# Patient Record
Sex: Female | Born: 1975 | Race: White | Hispanic: No | Marital: Married | Smoking: Never smoker
Health system: Southern US, Community
[De-identification: ages and names within clinical notes are randomized; demographics above are authoritative.]

## PROBLEM LIST (undated history)

## (undated) DIAGNOSIS — T7840XA Allergy, unspecified, initial encounter: Secondary | ICD-10-CM

## (undated) DIAGNOSIS — K5792 Diverticulitis of intestine, part unspecified, without perforation or abscess without bleeding: Secondary | ICD-10-CM

## (undated) DIAGNOSIS — Z3009 Encounter for other general counseling and advice on contraception: Secondary | ICD-10-CM

## (undated) DIAGNOSIS — J329 Chronic sinusitis, unspecified: Secondary | ICD-10-CM

## (undated) HISTORY — DX: Chronic sinusitis, unspecified: J32.9

## (undated) HISTORY — DX: Allergy, unspecified, initial encounter: T78.40XA

## (undated) HISTORY — DX: Diverticulitis of intestine, part unspecified, without perforation or abscess without bleeding: K57.92

## (undated) HISTORY — DX: Encounter for other general counseling and advice on contraception: Z30.09

---

## 2003-07-10 ENCOUNTER — Encounter: Admission: RE | Admit: 2003-07-10 | Discharge: 2003-07-10 | Payer: Self-pay | Admitting: Family Medicine

## 2003-07-20 ENCOUNTER — Encounter: Admission: RE | Admit: 2003-07-20 | Discharge: 2003-07-20 | Payer: Self-pay | Admitting: Family Medicine

## 2004-03-24 ENCOUNTER — Ambulatory Visit (HOSPITAL_COMMUNITY): Admission: RE | Admit: 2004-03-24 | Discharge: 2004-03-24 | Payer: Self-pay | Admitting: Obstetrics & Gynecology

## 2004-07-22 ENCOUNTER — Encounter (INDEPENDENT_AMBULATORY_CARE_PROVIDER_SITE_OTHER): Payer: Self-pay | Admitting: Specialist

## 2004-07-22 ENCOUNTER — Ambulatory Visit (HOSPITAL_BASED_OUTPATIENT_CLINIC_OR_DEPARTMENT_OTHER): Admission: RE | Admit: 2004-07-22 | Discharge: 2004-07-22 | Payer: Self-pay | Admitting: Plastic Surgery

## 2004-07-22 ENCOUNTER — Ambulatory Visit (HOSPITAL_COMMUNITY): Admission: RE | Admit: 2004-07-22 | Discharge: 2004-07-22 | Payer: Self-pay | Admitting: Plastic Surgery

## 2004-08-26 ENCOUNTER — Ambulatory Visit: Payer: Self-pay | Admitting: Internal Medicine

## 2004-10-20 ENCOUNTER — Ambulatory Visit: Payer: Self-pay | Admitting: Family Medicine

## 2006-01-31 ENCOUNTER — Inpatient Hospital Stay (HOSPITAL_COMMUNITY): Admission: AD | Admit: 2006-01-31 | Discharge: 2006-02-03 | Payer: Self-pay | Admitting: Obstetrics & Gynecology

## 2006-01-31 ENCOUNTER — Encounter (INDEPENDENT_AMBULATORY_CARE_PROVIDER_SITE_OTHER): Payer: Self-pay | Admitting: *Deleted

## 2006-07-21 ENCOUNTER — Ambulatory Visit: Payer: Self-pay | Admitting: Family Medicine

## 2006-11-24 ENCOUNTER — Emergency Department (HOSPITAL_COMMUNITY): Admission: EM | Admit: 2006-11-24 | Discharge: 2006-11-24 | Payer: Self-pay | Admitting: Family Medicine

## 2007-01-20 ENCOUNTER — Ambulatory Visit: Payer: Self-pay | Admitting: Family Medicine

## 2007-01-20 DIAGNOSIS — B309 Viral conjunctivitis, unspecified: Secondary | ICD-10-CM | POA: Insufficient documentation

## 2007-08-17 ENCOUNTER — Ambulatory Visit: Payer: Self-pay | Admitting: Family Medicine

## 2007-08-17 DIAGNOSIS — J069 Acute upper respiratory infection, unspecified: Secondary | ICD-10-CM | POA: Insufficient documentation

## 2007-08-17 LAB — CONVERTED CEMR LAB: Rapid Strep: NEGATIVE

## 2007-08-22 ENCOUNTER — Telehealth (INDEPENDENT_AMBULATORY_CARE_PROVIDER_SITE_OTHER): Payer: Self-pay | Admitting: *Deleted

## 2007-08-24 ENCOUNTER — Telehealth (INDEPENDENT_AMBULATORY_CARE_PROVIDER_SITE_OTHER): Payer: Self-pay | Admitting: *Deleted

## 2008-04-09 ENCOUNTER — Ambulatory Visit: Payer: Self-pay | Admitting: Family Medicine

## 2008-04-11 ENCOUNTER — Telehealth: Payer: Self-pay | Admitting: Family Medicine

## 2009-06-25 ENCOUNTER — Ambulatory Visit: Payer: Self-pay | Admitting: Family Medicine

## 2009-06-25 DIAGNOSIS — J019 Acute sinusitis, unspecified: Secondary | ICD-10-CM

## 2009-07-01 ENCOUNTER — Telehealth: Payer: Self-pay | Admitting: Family Medicine

## 2009-09-06 ENCOUNTER — Telehealth: Payer: Self-pay | Admitting: Family Medicine

## 2010-09-23 NOTE — Progress Notes (Signed)
Summary: Wants Tamilflu as precaution  Phone Note Call from Patient Call back at [phone]   Caller: Patient Call For: Kerby Nora MD Summary of Call: Pt 's daughter diagnosed with flu at [facility]. Pt's daughters dr. suggest pt getting Tamiflu. Pt has no symptoms at this time. [facility] uses [facility] [address] Please advise.  Initial call taken by: Lewanda Rife LPN,  September 06, 2009 5:19 PM  Follow-up for Phone Call        Patient Advised.  Follow-up by: Delilah Shan CMA (AAMA),  September 06, 2009 5:35 PM    New/Updated Medications: TAMIFLU 75 MG CAPS (OSELTAMIVIR PHOSPHATE) 1 tab by mouth daily x 10 days Prescriptions: TAMIFLU 75 MG CAPS (OSELTAMIVIR PHOSPHATE) 1 tab by mouth daily x 10 days  #10 x 0   Entered and Authorized by:   Kerby Nora MD   Signed by:   Kerby Nora MD on 09/06/2009   Method used:   Electronically to        [facility] (retail)       [address]       [place], [place]  [address]       Ph: [phone]       Fax: [phone]   RxID:   [id number]

## 2010-11-04 ENCOUNTER — Encounter: Payer: Self-pay | Admitting: Family Medicine

## 2010-11-04 ENCOUNTER — Ambulatory Visit (INDEPENDENT_AMBULATORY_CARE_PROVIDER_SITE_OTHER): Payer: 59 | Admitting: Family Medicine

## 2010-11-04 DIAGNOSIS — J029 Acute pharyngitis, unspecified: Secondary | ICD-10-CM

## 2010-11-04 DIAGNOSIS — J019 Acute sinusitis, unspecified: Secondary | ICD-10-CM

## 2010-11-04 DIAGNOSIS — K5289 Other specified noninfective gastroenteritis and colitis: Secondary | ICD-10-CM

## 2010-11-04 DIAGNOSIS — J309 Allergic rhinitis, unspecified: Secondary | ICD-10-CM

## 2010-11-05 ENCOUNTER — Ambulatory Visit: Payer: Self-pay | Admitting: Family Medicine

## 2010-11-11 NOTE — Assessment & Plan Note (Signed)
Summary: SORE,THROAT,FEVER AND VOMITING   Vital Signs:  Patient Profile:   35 Years Old Female CC:      Sore Throat and Fever Height:     65.5 inches O2 Sat:      99 % O2 treatment:    Room Air Temp:     97.8 degrees F oral Pulse rate:   101 / minute Pulse rhythm:   regular Resp:     16 per minute BP sitting:   126 / 82  (left arm)  Pt. in pain?   yes    Location:   throat  Vitals Entered By: Standley Dakins MD (November 04, 2010 3:45 PM)                   Current Allergies (reviewed today): ! PENICILLIN V POTASSIUM (PENICILLIN V POTASSIUM) ! ZITHROMAXHistory of Present Illness History from: patient Chief Complaint: Sore Throat and Fever History of Present Illness: The patient presented today with 2 days of worsening sore throat and fever.   She has had a raw throat.  She has had some allergy symptoms and has been taking Zyrtec OTC.  She has had decreased appetite, vomited once this morning, nausea and malaise.  Her daughter was sick with a cold last week.  She has no postnasal drainage, no sinus pressure, no headache,  no abdominal pain, no diarrhea.   She says that her throat feels raw and it hurts to swallow.  She is not eating but is able to drink gingerale and Sprite.  She has had some fevers at home.    REVIEW OF SYSTEMS Constitutional Symptoms       Complains of fever and fatigue.     Denies chills, night sweats, weight loss, and weight gain.  Eyes       Denies change in vision, eye pain, eye discharge, glasses, contact lenses, and eye surgery. Ear/Nose/Throat/Mouth       Complains of sore throat.      Denies hearing loss/aids, change in hearing, ear pain, ear discharge, dizziness, frequent runny nose, frequent nose bleeds, sinus problems, hoarseness, and tooth pain or bleeding.  Respiratory       Denies dry cough, productive cough, wheezing, shortness of breath, asthma, bronchitis, and emphysema/COPD.  Cardiovascular       Denies murmurs, chest pain, and tires easily  with exhertion.    Gastrointestinal       Complains of nausea/vomiting.      Denies stomach pain, diarrhea, constipation, blood in bowel movements, and indigestion. Genitourniary       Denies painful urination, kidney stones, and loss of urinary control. Neurological       Denies paralysis, seizures, and fainting/blackouts. Musculoskeletal       Denies muscle pain, joint pain, joint stiffness, decreased range of motion, redness, swelling, muscle weakness, and gout.  Skin       Denies bruising, unusual mles/lumps or sores, and hair/skin or nail changes.  Psych       Denies mood changes, temper/anger issues, anxiety/stress, speech problems, depression, and sleep problems.  Past History:  Family History: Last updated: 11/04/2010 Hypertension - Father   Social History: Last updated: 11/04/2010 Marital Status: Married Children: 1 young daughter  (born 2007) Occupation: 06/2009--working for [employer] firm   Past Medical History: Family Planning Allergic Rhinitis  Past Surgical History: Cesarean Section - January 31, 2006  Family History: Hypertension - Father   Social History: Reviewed history from 06/25/2009 and no changes required. Marital Status: Married  Children: 1 young daughter  (born 2007) Occupation: 06/2009--working for [employer] firm  Physical Exam General appearance: well developed, well nourished, no acute distress Head: normocephalic, atraumatic Eyes: conjunctivae and lids normal Pupils: equal, round, reactive to light Ears: normal, no lesions or deformities Nasal: pale, boggy, swollen nasal turbinates Oral/Pharynx: pharyngeal erythema without exudate, uvula midline without deviation Neck: neck supple,  trachea midline, no masses Chest/Lungs: no rales, wheezes, or rhonchi bilateral, breath sounds equal without effort Heart: normal s1 and s2 sounds, no murmur heard Abdomen: soft, non-tender without obvious organomegaly Extremities: normal extremities Neurological: grossly  intact and non-focal Skin: no obvious rashes or lesions MSE: oriented to time, place, and person Assessment Problems:   SINUSITIS- ACUTE-NOS (ICD-461.9) URI (ICD-465.9) CONJUNCTIVITIS, VIRAL, ACUTE (ICD-077.99) New Problems: ? of ACUTE SINUSITIS, UNSPECIFIED (ICD-461.9) RHINOSINUSITIS, ALLERGIC (ICD-477.9) GASTROENTERITIS (ICD-558.9) ACUTE PHARYNGITIS (ICD-462)   Patient Education: Patient and/or caregiver instructed in the following: rest, fluids, Tylenol prn, Ibuprofen prn. The risks, benefits and possible side effects were clearly explained and discussed with the patient.  The patient verbalized clear understanding.  The patient was given instructions to return if symptoms don't improve, worsen or new changes develop.  If it is not during clinic hours and the patient cannot get back to this clinic then the patient was told to seek medical care at an available urgent care or emergency department.  The patient verbalized understanding.   Demonstrates willingness to comply.  Plan New Medications/Changes: PROMETHAZINE HCL 25 MG TABS (PROMETHAZINE HCL) take 1 by mouth up to every 6 hours as needed nausea and vomiting: Caution Will Cause Drowsiness  #12 x 0, 11/04/2010, Geisha Abernathy MD IBUPROFEN 800 MG TABS (IBUPROFEN) take 1 by mouth every 8 hours with food as needed sore throat  #12 x 0, 11/04/2010, Jenine Krisher MD DOXYCYCLINE HYCLATE 100 MG TABS (DOXYCYCLINE HYCLATE) take 1 by mouth two times a day with food until completed  #20 x 0, 11/04/2010, Elliett Guarisco MD  Planning Comments:   Hold off taking antibiotics for about a week.  If you develop a yellow thick foul tasting discharge please start the antibiotics.  If your symptoms are not resolving with conservative therapy then start the antibiotics.    Follow Up: Follow up in 2-3 days if no improvement, Follow up on an as needed basis, Follow up with Primary Physician  The patient and/or caregiver has been counseled  thoroughly with regard to medications prescribed including dosage, schedule, interactions, rationale for use, and possible side effects and they verbalize understanding.  Diagnoses and expected course of recovery discussed and will return if not improved as expected or if the condition worsens. Patient and/or caregiver verbalized understanding.  Prescriptions: PROMETHAZINE HCL 25 MG TABS (PROMETHAZINE HCL) take 1 by mouth up to every 6 hours as needed nausea and vomiting: Caution Will Cause Drowsiness  #12 x 0   Entered and Authorized by:   Standley Dakins MD   Signed by:   Standley Dakins MD on 11/04/2010   Method used:   Electronically to        [facility]       [address]       [place], [place]  [address]       Ph: [phone]       Fax: [phone]   RxID:   [id number] IBUPROFEN 800 MG TABS (IBUPROFEN) take 1 by mouth every 8 hours with food as needed sore throat  #12 x  0   Entered and Authorized by:   Standley Dakins MD   Signed by:   Standley Dakins MD on 11/04/2010   Method used:   Electronically to        [facility]       [address]       [place], [place]  [address]       Ph: [phone]       Fax: [phone]   RxID:   [id number] DOXYCYCLINE HYCLATE 100 MG TABS (DOXYCYCLINE HYCLATE) take 1 by mouth two times a day with food until completed  #20 x 0   Entered and Authorized by:   Standley Dakins MD   Signed by:   Standley Dakins MD on 11/04/2010   Method used:   Electronically to        [facility]       [address]       [place], [place]  [address]       Ph: [phone]       Fax: [phone]   RxID:   [id number]   Patient Instructions: 1)  Take 800mg  of Ibuprofen (Advil, Motrin) with food every 8  hours as needed for relief of pain or comfort of fever. 2)  Oral Rehydration  Solution: drink 1/2 ounce every 15 minutes. If tolerated afert 1 hour, drink 1 ounce every 15 minutes. As you can tolerate, keep adding 1/2 ounce every 15 minutes, up to a total of 2-4 ounces. Contact the office if unable to tolerate oral solution, if you keep vomiting, or you continue to have signs of dehydration. 3)  Take your antibiotic as prescribed until ALL of it is gone, but stop if you develop a Shen or swelling and contact our office as soon as possible. 4)  Acute sinusitis symptoms for less than 10 days are not helped by antibiotics.Use warm moist compresses, and over the counter decongestants ( only as directed). Call if no improvement in 5-7 days, sooner if increasing pain, fever, or new symptoms. 5)  Go ahead and start the antibiotics if not improved after 1 more week of conservative therapy or worsening of your symptoms.   6)  Return or go to the ER if no improvement or symptoms getting worse.   7)  The patient was informed that there is no on-call provider or services available at this clinic during off-hours (when the clinic is closed).  If the patient developed a problem or concern that required immediate attention, the patient was advised to go the the nearest available urgent care or emergency department for medical care.  The patient verbalized understanding.

## 2010-11-25 NOTE — Letter (Signed)
Summary: History Form  History Form   Imported By: Eugenio Hoes 11/17/2010 12:11:00  _____________________________________________________________________  External Attachment:    Type:   Image     Comment:   External Document

## 2011-01-09 NOTE — Op Note (Signed)
NAMECORINE, Christina [name]               ACCOUNT NO.:  [id number]   MEDICAL RECORD NO.:  [id number]          PATIENT TYPE:  AMB   LOCATION:  DSC                          FACILITY:  [facility]   PHYSICIAN:  Brantley Persons, M.D.DATE OF BIRTH:  1975/11/06   DATE OF PROCEDURE:  07/22/2004  DATE OF DISCHARGE:                                 OPERATIVE REPORT   PREOPERATIVE DIAGNOSIS:  Spitz tumor, left cheek.   POSTOPERATIVE DIAGNOSIS:  Spitz tumor, left cheek.   OPERATION PERFORMED:  1.  Wide local excision of 0.7 cm left lower cheek Spitz tumor.  2.  Intermediate closure of a 1.4 cm left lower cheek incision.   SURGEON:  Brantley Persons, M.D.   ANESTHESIA:  1% lidocaine with epinephrine.   COMPLICATIONS:  None.   INDICATIONS FOR PROCEDURE:  The patient is a 35 year old Caucasian female  who is a patient of Laurita Quint, M.D.  The patient presented to Dr.  Hetty Ely with this lesion and she was then referred to [facility]. Danella Deis, M.D.  Dr. Danella Deis biopsied the skin lesion and the pathology report indicates that  the left cheek skin lesion is a Spitz tumor.  The patient therefore presents  to undergo wide local excision of the tumor.   DESCRIPTION OF PROCEDURE:  The patient was brought to the minor room and  placed on the table in the supine position.  The left cheek and face was  prepped with Betadine and draped in sterile fashion.  The skin and  subcutaneous tissues in the area of the Spitz tumor were injected with 1%  lidocaine with epinephrine.  After adequate hemostasis and anesthesia had  taken effect, the procedure was begun.  Using loupe magnification, the Spitz  tumor and healing biopsy site were identified.  2 mm margins were then  marked circumferentially around this mass.  The lesion was then excised full  thickness through the skin into the subcutaneous tissue, marked at the 12  o'clock position with a suture and passed off the table to undergo permanent  pathologic section  evaluation.  The skin edges and subcutaneous tissues were  then undermined for easier closure.  Excellent hemostasis was present.  The  incision was then closed in intermediate fashion.  The deeper subcutaneous  tissues and deep dermal layer were closed using 4-0 Monocryl suture.  The  superficial dermal layer was also closed with 4-0 Monocryl suture as  appropriate.  The skin was then closed with a 6-0 Prolene running baseball  stitch.  The incision was dressed with bacitracin ointment.  There were no complications.  The patient tolerated the procedure well.  She  was then given proper postoperative wound care instruction and discharged  home in stable condition.  Follow-up appointment will be tomorrow in the  office.       MC/MEDQ  D:  07/22/2004  T:  07/23/2004  Job:  [id number]

## 2011-01-09 NOTE — Discharge Summary (Signed)
NAMESRI, CLEGG               ACCOUNT NO.:  [id number]   MEDICAL RECORD NO.:  [id number]          PATIENT TYPE:  INP   LOCATION:                                [facility]   PHYSICIAN:  Genia Del, M.D.DATE OF BIRTH:  [birth year]   DATE OF ADMISSION:  01/31/2006  DATE OF DISCHARGE:  02/03/2006                                 DISCHARGE SUMMARY   ADMISSION DIAGNOSIS:  At 40 weeks and 5 days gestation, postdates and  induction of labor.   DISCHARGE DIAGNOSIS:  Postoperative day three, status post cesarean section,  stable status.   PATIENT PRESENTATION:  The patient is a 35 year old, gravida 1 at 40 weeks  and 5 days by last menstrual period, [facility] of January 26, 2006.  Vital signs on  admission showed temperature 97.4, pulse 71, respirations 20, blood pressure  118/59.  Cervical exam of 2 cm, 80% vertex presentation with a -2 station.  Membranes intact.  Fetal heart rate tracing is 140, reactive with a regular  rare contraction.   PRENATAL COURSE:  The patient has received prenatal care at [facility] since first trimester.  The patient is on prenatal vitamin one  tablet daily.  The patient has known allergy to penicillin.  The patient is  A positive, antibody screen negative.  Rubella is immuned.  Hepatitis is  nonreactive.  Syphilis nonreactive.  HIV nonreactive.  Gonorrhea and  Chlamydia cultures were negative with a positive group beta strep culture.   The patient is admitted for induction of labor, antibiotic prophylaxis with  Clindamycin for positive GBS and penicillin allergy.  Pitocin low dose  protocol with artificial rupture of membranes.   LABOR COURSE:  No progressive cervical change in active phase of labor with  repetitive fetal heart rate decelerations, nonreassuring fetal heart rate  pattern.  The patient is counseled with risks and benefits of surgery.  The  patient undergoes primary cesarean section at 9:25 p.m. with Dr. Genia Del.  See operative note.  The patient delivered a female at 2051 with  Apgar's of 8 and 9.  Birth weight of 9 pounds 5 ounces.   HOSPITAL COURSE:  Postoperative day one, the patient reports mild nausea and  dizziness.  On postoperative day one, vital signs are stable.  The patient  is afebrile.  Postoperative labs show white blood cell count is 13.7,  hemoglobin is 12.2, hematocrit 36.1 and a platelet count of 283,000.  Physical exam is within normal limits.   Hospital day two, the patient is up ad lib, tolerating diet, voiding without  difficulty.  Vital signs are stable.  The patient is afebrile.  Incision is  clean, dry and well approximated with staples.   Hospital day three, date of discharge, the patient has minimal discomfort.  The patient is breastfeeding without difficulty, tolerating diet.  Positive  bowel motility with bowel movements since postoperative day two.  Vital  signs are stable.  The patient is afebrile.  Staples are removed on day of  discharge with Steri-Strips applied.  Incision is well approximated without  drainage or  redness.  The patient is discharged home in stable condition.   DISCHARGE INSTRUCTIONS:  The patient is on regular diet.  Activity  restrictions are postoperative per instruction booklet.  Medications are  ibuprofen 800 mg every eight hours as needed for discomfort, Percocet 1-2  tablets every 4-6 hours as needed for pain, a prenatal vitamin daily.  May  use Colace over-the-counter stool softener as needed for constipation.  To  return to [facility] in six weeks as scheduled.      Marlinda Mike, C.N.M.      Genia Del, M.D.  Electronically Signed    TB/MEDQ  D:  03/29/2006  T:  03/30/2006  Job:  [id number]

## 2011-01-09 NOTE — Op Note (Signed)
NAMEJAZZMINE, Christina [name]               ACCOUNT NO.:  [id number]   MEDICAL RECORD NO.:  [id number]          PATIENT TYPE:  INP   LOCATION:  9147                          FACILITY:  [facility]   PHYSICIAN:  Genia Del, M.D.DATE OF BIRTH:  [birth year]   DATE OF PROCEDURE:  01/31/2006  DATE OF DISCHARGE:                                 OPERATIVE REPORT   PREOPERATIVE DIAGNOSIS:  40 weeks and 5 days gestation, induction for post  dates, nonreassuring fetal heart rate monitoring with repetitive late  decelerations, failure to progress.   POSTOPERATIVE DIAGNOSIS:  40 weeks and 5 days gestation, induction for post  dates, nonreassuring fetal heart rate monitoring with repetitive late  decelerations, failure to progress, meconium in amniotic fluid.   OPERATION PERFORMED:  Urgent primary low transverse cesarean section.   SURGEON:  Genia Del, M.D.   ANESTHESIOLOGIST:  Angelica Pou, MD   DESCRIPTION OF PROCEDURE:  Under epidural anesthesia, the patient in 15  degree left decubitus position, she was prepped with Betadine on the  abdominal, suprapubic and vulvar areas.  The bladder catheter is already in  place.  The patient is draped as usual.  An infiltration of Marcaine 0.25%  plain 10 mL is done subcutaneously at the site where the Pfannenstiel  incision will be done.  Pfannenstiel incision with scalpel.  We then opened  the adipose tissue with the electrocautery cutting mode.  We opened the  aponeurosis transversely with the electrocautery and the Mayo scissors.  We  separated the rectus muscles from the aponeurosis in the midline superiorly  and inferiorly.  The parietal peritoneum is opened longitudinally with  Metzenbaum scissors.  We then put the bladder retractor in place.  The  visceral peritoneum is opened transversely over the lower uterine segment  with Metzenbaum scissors.  We reclined the bladder downward.  We then make a  low transverse hysterotomy with a scalpel.   Extension on each side is done  with dressing scissors.  Fluid, meconium is present which is a new finding.  The fetus is in cephalic position.  A loose nuchal cord is present.  Birth  of a baby girl at 2051.  The cord is clamped and cut.  The baby is given to  the neonatal team.  Apgars are 8 and 9.  The baby was suctioned right after  delivery.  A pH is done which comes back at 7.29.  The weight of the baby is  9 pounds and 5 ounces.  Evacuation of the placenta which is complete with  three vessels.  Uterine revision is done.  The uterus contracted well with  Pitocin IV.  A dose of Flagyl 500 mg IV was given.  We closed the  hysterotomy with a first locked running suture of Vicryl 0, a second plain  in mattress fashion is done with Vicryl 0.  Hemostasis is adequate.  We then  irrigate and suction the abdominopelvic cavity.  Hemostasis was completed  with the electrocautery where necessary.  Both tubes and both ovaries were  normal to inspection.  The uterus was well contracted and normal  in size and  appearance.  We then complete hemostasis on the  adipose tissue with the electrocautery.  The skin is reapproximated with  staples.  The count of instruments and sponges was complete x2.  A dry  dressing is applied on the incision.  The estimated blood loss was 600 mL.  No complications occurred and the patient was brought to recovery room in  good status.      Genia Del, M.D.  Electronically Signed     ML/MEDQ  D:  01/31/2006  T:  02/01/2006  Job:  [id number]

## 2011-03-02 ENCOUNTER — Ambulatory Visit (INDEPENDENT_AMBULATORY_CARE_PROVIDER_SITE_OTHER): Payer: BC Managed Care – PPO | Admitting: Family Medicine

## 2011-03-02 ENCOUNTER — Encounter: Payer: Self-pay | Admitting: Family Medicine

## 2011-03-02 VITALS — BP 120/76 | HR 74 | Temp 98.2°F | Wt 187.0 lb

## 2011-03-02 DIAGNOSIS — J029 Acute pharyngitis, unspecified: Secondary | ICD-10-CM

## 2011-03-02 LAB — POCT RAPID STREP A (OFFICE): Rapid Strep A Screen: POSITIVE — AB

## 2011-03-02 MED ORDER — CLINDAMYCIN HCL 300 MG PO CAPS: ORAL_CAPSULE | ORAL | Status: DC

## 2011-03-02 NOTE — Progress Notes (Signed)
  Subjective:    Patient ID: Christina [name], female    DOB: 24-Mar-1976, 35 y.o.   MRN: [id number]  HPI  Sore Throat Patient complains of sore throat. Associated symptoms include fevers up to 100.5 degrees, sore throat and swollen glands. Onset of symptoms was 1 day ago, and have been gradually worsening since that time. She is drinking plenty of fluids. She has not had recent close exposure to someone with proven streptococcal pharyngitis.  Review of Systems See HPI Denies [place], nausea, vomiting or headaches.  Allergic to PCN and Azithromycin.    Objective:   Physical Exam BP 120/76  Pulse 74  Temp(Src) 98.2 F (36.8 C) (Oral)  Wt 187 lb (84.823 kg)  LMP 02/28/2011 Gen:   Alert, pleasant, NAD HEENT:  +tonsillar erythema, +exudate, no edema Skin:  No rashes Abd:  Soft, NT       Assessment & Plan:   1. Sore throat  POCT rapid strep A   Rapid strep positive. Treat with Clindamycin given PCN and Azithromycin allergy. Continue supportive care with Ibuprofen. The patient indicates understanding of these issues and agrees with the plan.

## 2011-03-09 ENCOUNTER — Telehealth: Payer: Self-pay | Admitting: *Deleted

## 2011-03-09 NOTE — Telephone Encounter (Signed)
Patient was seen last week with strep throat and she has 3 days of antibiotic left, but she says that she isn't really feeling any better. Her throat is still very sore. She is asking if she should try a different antibiotic. Uses [facility] [address]

## 2011-03-09 NOTE — Telephone Encounter (Signed)
Patient advised as instructed via telephone. 

## 2011-03-09 NOTE — Telephone Encounter (Signed)
The pain can take time.  She is allergic to all other classes of antibiotics that can cover Group A strep. I would recommend finishing the course of antibiotics, taking Ibuprofen as needed for pain. We want to make sure she is receiving an antibiotic that can cover strep (the one we prescribed is one) and all the others are related to penicillin and azithromycin which she is allergic to.

## 2011-03-10 ENCOUNTER — Emergency Department (HOSPITAL_COMMUNITY)
Admission: EM | Admit: 2011-03-10 | Discharge: 2011-03-10 | Disposition: A | Discharge status: Home / Self Care | Payer: BC Managed Care – PPO | Attending: Emergency Medicine | Admitting: Emergency Medicine

## 2011-03-10 ENCOUNTER — Telehealth: Payer: Self-pay | Admitting: *Deleted

## 2011-03-10 DIAGNOSIS — J020 Streptococcal pharyngitis: Secondary | ICD-10-CM | POA: Insufficient documentation

## 2011-03-10 DIAGNOSIS — R599 Enlarged lymph nodes, unspecified: Secondary | ICD-10-CM | POA: Insufficient documentation

## 2011-03-10 DIAGNOSIS — R509 Fever, unspecified: Secondary | ICD-10-CM | POA: Insufficient documentation

## 2011-03-10 DIAGNOSIS — R110 Nausea: Secondary | ICD-10-CM | POA: Insufficient documentation

## 2011-03-10 LAB — MONONUCLEOSIS SCREEN: Mono Screen: NEGATIVE

## 2011-03-10 LAB — RAPID STREP SCREEN (MED CTR MEBANE ONLY): Streptococcus, Group A Screen (Direct): NEGATIVE

## 2011-03-10 NOTE — Telephone Encounter (Signed)
The problem is that she is allergic to penicillins and azithromycin so I am afraid to give her any abx that are in same class. The diarrhea and nausea is likely from the clindamycin.  It is very hard on the stomach.  What type of reaction did she have to Azithromycin?  If it's a true allergy, I cannot give her anything in the same class. The only other option would be IV antibiotics.  If her fever is this high, she may need to go to the ER to get treated with IV abx.  I know that is not fun and she has a family but if she is truly allergic to azithromycin and PCN, there is no other alternative.  Christina [name] is a little quicker and we could call first to let them know she is coming.

## 2011-03-10 NOTE — Telephone Encounter (Signed)
Patient advised as instructed via telephone.  She stated that she had a Kulik on her hand from taking Azithromycin.  She stated that she will speak with her husband to decide what to do and call us back to let us know.

## 2011-03-10 NOTE — Telephone Encounter (Signed)
Pt was seen 7/9 and diagnosed with strep.  She is taking clindomycin.  She is not any better.  She has had nausea and diarrhea since Sunday, fever of 102.2 today.  She has 3 days left of antibiotic.  Please advise on what she should do.  Advised pt to push fluids, avoid dairy products.

## 2011-03-11 LAB — STREP A DNA PROBE: Group A Strep Probe: POSITIVE

## 2011-03-19 ENCOUNTER — Telehealth: Payer: Self-pay | Admitting: Radiology

## 2011-03-19 ENCOUNTER — Ambulatory Visit (INDEPENDENT_AMBULATORY_CARE_PROVIDER_SITE_OTHER)
Admission: RE | Admit: 2011-03-19 | Discharge: 2011-03-19 | Disposition: A | Discharge status: Home / Self Care | Payer: BC Managed Care – PPO | Source: Ambulatory Visit | Attending: Family Medicine | Admitting: Family Medicine

## 2011-03-19 ENCOUNTER — Encounter: Payer: Self-pay | Admitting: Family Medicine

## 2011-03-19 ENCOUNTER — Telehealth: Payer: Self-pay | Admitting: Family Medicine

## 2011-03-19 ENCOUNTER — Ambulatory Visit (INDEPENDENT_AMBULATORY_CARE_PROVIDER_SITE_OTHER): Payer: BC Managed Care – PPO | Admitting: Family Medicine

## 2011-03-19 VITALS — BP 120/80 | HR 90 | Temp 98.8°F | Wt 185.5 lb

## 2011-03-19 DIAGNOSIS — R509 Fever, unspecified: Secondary | ICD-10-CM

## 2011-03-19 DIAGNOSIS — J029 Acute pharyngitis, unspecified: Secondary | ICD-10-CM

## 2011-03-19 LAB — CBC WITH DIFFERENTIAL/PLATELET
Basophils Absolute: 0 10*3/uL (ref 0.0–0.1)
Basophils Relative: 0.2 % (ref 0.0–3.0)
Eosinophils Absolute: 0.6 10*3/uL (ref 0.0–0.7)
Eosinophils Relative: 2.6 % (ref 0.0–5.0)
HCT: 38.9 % (ref 36.0–46.0)
Hemoglobin: 13.2 g/dL (ref 12.0–15.0)
Lymphocytes Relative: 6.1 % — ABNORMAL LOW (ref 12.0–46.0)
Lymphs Abs: 1.3 10*3/uL (ref 0.7–4.0)
MCHC: 34 g/dL (ref 30.0–36.0)
MCV: 89 fl (ref 78.0–100.0)
Monocytes Absolute: 1.4 10*3/uL — ABNORMAL HIGH (ref 0.1–1.0)
Monocytes Relative: 6.6 % (ref 3.0–12.0)
Neutro Abs: 18.5 10*3/uL — ABNORMAL HIGH (ref 1.4–7.7)
Neutrophils Relative %: 84.5 % — ABNORMAL HIGH (ref 43.0–77.0)
Platelets: 358 10*3/uL (ref 150.0–400.0)
RBC: 4.37 Mil/uL (ref 3.87–5.11)
RDW: 13.1 % (ref 11.5–14.6)
WBC: 21.9 10*3/uL (ref 4.5–10.5)

## 2011-03-19 LAB — BASIC METABOLIC PANEL
BUN: 8 mg/dL (ref 6–23)
CO2: 27 mEq/L (ref 19–32)
Calcium: 8.6 mg/dL (ref 8.4–10.5)
Chloride: 100 mEq/L (ref 96–112)
Creatinine, Ser: 0.8 mg/dL (ref 0.4–1.2)
GFR: 89.49 mL/min (ref >60.00)
Glucose, Bld: 93 mg/dL (ref 70–99)
Potassium: 3.8 mEq/L (ref 3.5–5.1)
Sodium: 134 mEq/L — ABNORMAL LOW (ref 135–145)

## 2011-03-19 MED ORDER — DOXYCYCLINE HYCLATE 100 MG PO TABS: 100.0000 mg | ORAL_TABLET | Freq: Two times a day (BID) | ORAL | Status: AC

## 2011-03-19 MED ORDER — MOXIFLOXACIN HCL 400 MG PO TABS: 400.0000 mg | ORAL_TABLET | Freq: Every day | ORAL | Status: AC

## 2011-03-19 MED ORDER — IOHEXOL 300 MG/ML  SOLN
75.0000 mL | Freq: Once | INTRAMUSCULAR | Status: AC | PRN
  Administered 2011-03-19: 75 mL via INTRAVENOUS

## 2011-03-19 MED ORDER — MOXIFLOXACIN HCL 400 MG PO TABS: ORAL_TABLET | ORAL | Status: DC

## 2011-03-19 MED ORDER — GUAIFENESIN-CODEINE 100-10 MG/5ML PO SYRP: 5.0000 mL | ORAL_SOLUTION | Freq: Every evening | ORAL | Status: DC | PRN

## 2011-03-19 NOTE — Progress Notes (Signed)
Addended by: Dianne Dun on: 03/19/2011 01:37 PM   Modules accepted: Orders

## 2011-03-19 NOTE — Telephone Encounter (Signed)
I attempted to call pt, left voicemail for her to return my call. I am very concerned about a deep neck space infection. I am ordering a CT of neck with contrast to evaluate further. Please have her start taking the abx I gave to her today, although it will not be sufficient for deep tissue neck infection, she will need IV abx and ENT referral if that is the case. I will place order for CT scan now.

## 2011-03-19 NOTE — Telephone Encounter (Signed)
Thank you.  Yes I agree. I switched to doxy to cover both strep and ?tick borne but Avelox is exactly what I would change back to. Thanks!!

## 2011-03-19 NOTE — Telephone Encounter (Signed)
[facility] called with critical WBC of 21.9.

## 2011-03-19 NOTE — Telephone Encounter (Signed)
Rose from CT calling... CT shows hypertrophy of tonsils but no abscess. Does show right upper lobe pneumonia.  Dr. Dayton Martes not available to take call.  Spoke with pt. Having B throat soreness but also cough. Reviewed notes in detail.  Done with clindamycin. Given rx for avelox 5 days to fill if needed because of possible concern of developing bronchitis.  When wbc found elevated.. Dr A changed to doxy, neither antibiotic filled yet.  For RUL PNA: Continue mucinex during day ,ibuprofen for fever, I have called l avelox 400 10 days and will have RN call in codeine/guafenesin for cough. As for follow up I will forward this to Dr. Dayton Martes for her recommendations. I would likely consider follow up first thing next week.  Pt told to go to ER if not keeping down antibiotics, fever on antibiotics, increase pain or SOB.

## 2011-03-19 NOTE — Telephone Encounter (Signed)
I spoke with JJ, pharm D. She recommended Doxycycline to cover strep and possible tickborne illness. Discussed with pt and [facility]. She can hold off on filling the avelox.

## 2011-03-19 NOTE — Telephone Encounter (Signed)
Rx called to pharmacy as instructed by Dr. Ermalene Searing.

## 2011-03-19 NOTE — Telephone Encounter (Signed)
Patient advised as instructed via telephone.  She will go ahead and get abx and she will wait to hear from [facility] regarding CT scan.

## 2011-03-19 NOTE — Progress Notes (Signed)
  Subjective:    Patient ID: Christina [name], female    DOB: 1975/12/06, 35 y.o.   MRN: [id number]  HPI 35 yo here for ER follow up.  I saw her on 7/9 for sore throat, fever and malaise. Rapid strep was positive. Allergic to PCN and Azithromycin, so she was prescribed clindamycin. She called on 7/17 saying she felt no better.  I advised her to go the ER because I was concerned about possible peritonsillar absces very hesitant to try another oral abx given her allergies that would have group a strep coverage. She agreed.  ER notes reviewed.  Rapid strep negative. Mono screen negative.  Pt had no symptoms of peritonsillar abscess in ER and sent home with 5 day course of Prednisone 50 mg daily and Zofran. Still having fevers, Tmax 102. Throat is no longer sore but now has a productive cough. NO [place], no nausea, no vomiting.     Patient Active Problem List  Diagnoses  . CONJUNCTIVITIS, VIRAL, ACUTE  . SINUSITIS- ACUTE-NOS  . URI  . Pharyngitis   Past Medical History  Diagnosis Date  . Family planning   . Allergy    Past Surgical History  Procedure Date  . Cesarean section 01/31/2006   History  Substance Use Topics  . Smoking status: Never Smoker   . Smokeless tobacco: Not on file  . Alcohol Use: Not on file   Family History  Problem Relation Age of Onset  . Hypertension Father    Allergies  Allergen Reactions  . Azithromycin     REACTION: [place]  . Penicillins    Current Outpatient Prescriptions on File Prior to Visit  Medication Sig Dispense Refill  . clindamycin (CLEOCIN) 300 MG capsule 2 caps po three times daily x 10 days.  60 capsule  0  . ibuprofen (ADVIL,MOTRIN) 800 MG tablet Take 800 mg by mouth every 8 (eight) hours as needed.        . Norethindrone Acet-Ethinyl Est (LOESTRIN 1.5/30, 21,) 1.5-30 MG-MCG TABS Take 1 tablet by mouth daily.         The PMH, PSH, Social History, Family History, Medications, and allergies have been reviewed in [facility], and  have been updated if relevant.   Review of Systems See HPI    Objective:   Physical Exam BP 120/80  Pulse 90  Temp(Src) 98.8 F (37.1 C) (Oral)  Wt 185 lb 8 oz (84.142 kg)  LMP 02/28/2011 Gen:  Alert, NAD, wellhydrated HEENT:  Tonsils enlarged but less erythematous, uvula appears midline CVS: RRR Resp:  CTA bilaterally       Assessment & Plan:   1. Pharyngitis   Improving but remains febrile now with new symptoms. Finished course of clindamycin. Does not have signs of peritonsillar abscess at this point. 2. Fever   Deteriorated- I am concerned that she has a fever, now with URI symptoms. ?viral infection developed while she was fighting rapid strep infection. Will check CBC today as not checked in ER. Given rx for avelox to fill if fever does not improve by Saturday to treat possible bronchitis that may be developing. The patient indicates understanding of these issues and agrees with the plan.

## 2011-03-20 NOTE — Telephone Encounter (Signed)
CT was done yesterday 03/19/2011 at [facility].

## 2011-03-30 ENCOUNTER — Telehealth: Payer: Self-pay | Admitting: *Deleted

## 2011-03-30 NOTE — Telephone Encounter (Signed)
Patient advised as instructed via telephone.  She has no fever.  She will just keep an eye on her symptoms and if they change or worsen she will let us know.

## 2011-03-30 NOTE — Telephone Encounter (Signed)
Pt treated for pneumonia last week, she just finished abx yesterday, but still c/o mild productive cough, fatigue. She want's to know if you need to see her again or should she just let it run it's course. She does feel better than when seen.

## 2011-03-30 NOTE — Telephone Encounter (Signed)
Does she still have a fever? If she is afebrile and fatigue is somewhat improved, she's most likely ok. Cough can take weeks to resolve and chest xray is not helpful for several more weeks after. If symptoms deteriorate again, she does need to be seen this week.

## 2011-04-04 ENCOUNTER — Encounter: Payer: Self-pay | Admitting: Family Medicine

## 2011-04-04 ENCOUNTER — Ambulatory Visit (INDEPENDENT_AMBULATORY_CARE_PROVIDER_SITE_OTHER): Payer: BC Managed Care – PPO | Admitting: Family Medicine

## 2011-04-04 VITALS — BP 106/80 | HR 66 | Temp 98.9°F | Ht 66.5 in | Wt 184.0 lb

## 2011-04-04 DIAGNOSIS — J029 Acute pharyngitis, unspecified: Secondary | ICD-10-CM

## 2011-04-04 MED ORDER — CEFDINIR 300 MG PO CAPS: 300.0000 mg | ORAL_CAPSULE | Freq: Two times a day (BID) | ORAL | Status: AC

## 2011-04-04 NOTE — Patient Instructions (Signed)
F/u with PCP if sx not improving after 4 days.

## 2011-04-04 NOTE — Progress Notes (Signed)
Subjective:    Patient ID: Christina [name], female    DOB: Aug 18, 1976, 35 y.o.   MRN: [id number]  HPI Dx with strep 4 weeks ago and treated with clindamycin bc of allergy to PCN and Azithro. About a week later tonsils were still red and then send to the ED and repeat test was neg. Then tonsils got worse and had a fever and sent for Neck CT and told didn't have an abscess but told told had PNA in the upper lungs. Then started on doxy for the PNA and completed her 10 day course about one week ago. Felt better and then Thrusday ( 4 days ago) noticed swelling and redness in her tonsils. No fever since Thursday. Has a burning and fullness sensation  In her throat. Now getting sore with eating and drinking.  Everyone in the house had strep about 4 weeks ago but none since then.    Review of Systems     BP 106/80  Pulse 66  Temp(Src) 98.9 F (37.2 C) (Oral)  Ht 5' 6.5" (1.689 m)  Wt 184 lb (83.462 kg)  BMI 29.25 kg/m2  LMP 04/02/2011    Allergies  Allergen Reactions  . Azithromycin     REACTION: [place]  . Penicillins     Past Medical History  Diagnosis Date  . Family planning   . Allergy     Past Surgical History  Procedure Date  . Cesarean section 01/31/2006    History   Social History  . Marital Status: Married    Spouse Name: N/A    Number of Children: 1  . Years of Education: N/A   Occupational History  . Working for [employer] firm    Social History Main Topics  . Smoking status: Never Smoker   . Smokeless tobacco: Not on file  . Alcohol Use: Not on file  . Drug Use: Not on file  . Sexually Active: Not on file   Other Topics Concern  . Not on file   Social History Narrative  . No narrative on file    Family History  Problem Relation Age of Onset  . Hypertension Father     [name] had no medications administered during this visit.  Objective:   Physical Exam  Constitutional: She is oriented to person, place, and time. She appears well-developed and  well-nourished.  HENT:  Head: Normocephalic and atraumatic.  Right Ear: External ear normal.  Left Ear: External ear normal.  Nose: Nose normal.       TMs and canals are clear. Right tonsils is 2+, no exudate.  Left tonsil on mildly swollen and no exudate.    Eyes: Conjunctivae and EOM are normal. Pupils are equal, round, and reactive to light.  Neck: Neck supple. No thyromegaly present.  Cardiovascular: Normal rate, regular rhythm and normal heart sounds.   Pulmonary/Chest: Effort normal and breath sounds normal. She has no wheezes.  Lymphadenopathy:    She has no cervical adenopathy.  Neurological: She is alert and oriented to person, place, and time.  Skin: Skin is warm and dry.  Psychiatric: She has a normal mood and affect.          Assessment & Plan:  Pharyngitis - Likely strep. Unfortunately to kits to test to confirm today. Will tx with cefdinir based on guidelines from UpToDate based on her allergies and what she has already used for tx.  I would prefer recephin injections but she wouldn't be able to get the next one  tomorrow as needed. F/u with PCP if sx not improving after 4 days.

## 2011-04-08 ENCOUNTER — Encounter: Payer: Self-pay | Admitting: Family Medicine

## 2011-04-08 ENCOUNTER — Ambulatory Visit (INDEPENDENT_AMBULATORY_CARE_PROVIDER_SITE_OTHER): Payer: BC Managed Care – PPO | Admitting: Family Medicine

## 2011-04-08 VITALS — BP 122/90 | HR 84 | Temp 97.9°F | Wt 184.0 lb

## 2011-04-08 DIAGNOSIS — J020 Streptococcal pharyngitis: Secondary | ICD-10-CM

## 2011-04-08 DIAGNOSIS — J029 Acute pharyngitis, unspecified: Secondary | ICD-10-CM

## 2011-04-08 DIAGNOSIS — J0301 Acute recurrent streptococcal tonsillitis: Secondary | ICD-10-CM

## 2011-04-08 MED ORDER — CEFTRIAXONE SODIUM 1 G IJ SOLR
1.0000 g | Freq: Once | INTRAMUSCULAR | Status: AC
  Administered 2011-04-08: 1 g via INTRAMUSCULAR

## 2011-04-08 NOTE — Progress Notes (Signed)
Addended by: Eliezer Bottom on: 04/08/2011 01:35 PM   Modules accepted: Orders

## 2011-04-08 NOTE — Progress Notes (Signed)
Dx with strep 4 weeks ago and treated with clindamycin bc of allergy to PCN and Azithro.  About a week later tonsils were still red and then send to the ED and repeat test was neg. Then tonsils got worse and had a fever and sent for Neck CT which was neg for an abscess but did show PNA in upper lobe.  Then started on Doxy, which we switched to Avelox with PNA diagnosis, but apparently pt did not pick avelox up at pharmacy?.   Completed her 10 day course about 1 1/2 weeks ago.   Felt better and then last Thrusday (6 days ago) noticed swelling and redness in her tonsils. No fever since Thursday. Has a burning and fullness sensation  In her throat.   Saw Dr. Linford Arnold in weekend clinic, no rapid strep testing available. Given 7 day course of cefdinir.  No allergic reaction to it.  Feels just a little better. Remains afebrile.    Patient Active Problem List  Diagnoses  . CONJUNCTIVITIS, VIRAL, ACUTE  . SINUSITIS- ACUTE-NOS  . URI  . Pharyngitis  . Fever   Past Medical History  Diagnosis Date  . Family planning   . Allergy    Past Surgical History  Procedure Date  . Cesarean section 01/31/2006   History  Substance Use Topics  . Smoking status: Never Smoker   . Smokeless tobacco: Not on file  . Alcohol Use: Not on file   Family History  Problem Relation Age of Onset  . Hypertension Father    Allergies  Allergen Reactions  . Azithromycin     REACTION: [place]  . Penicillins    Current Outpatient Prescriptions on File Prior to Visit  Medication Sig Dispense Refill  . cefdinir (OMNICEF) 300 MG capsule Take 1 capsule (300 mg total) by mouth 2 (two) times daily.  14 capsule  0  . dextromethorphan-guaiFENesin (MUCINEX DM) 30-600 MG per 12 hr tablet Take 1 tablet by mouth every 12 (twelve) hours.        Marland Kitchen guaiFENesin-codeine (ROBITUSSIN AC) 100-10 MG/5ML syrup Take 5 mLs by mouth at bedtime as needed for cough.  240 mL  0  . ibuprofen (ADVIL,MOTRIN) 800 MG tablet Take 800 mg by  mouth every 8 (eight) hours as needed.        . Norethindrone Acet-Ethinyl Est (LOESTRIN 1.5/30, 21,) 1.5-30 MG-MCG TABS Take 1 tablet by mouth daily.         The PMH, PSH, Social History, Family History, Medications, and allergies have been reviewed in [facility], and have been updated if relevant.  ROS:  See HPI   Objective:   BP 122/90  Pulse 84  Temp(Src) 97.9 F (36.6 C) (Oral)  Wt 184 lb (83.462 kg)  LMP 04/02/2011  Constitutional: She is oriented to person, place, and time. She appears well-developed and well-nourished.  HENT:   Head: Normocephalic and atraumatic.  Right Ear: External ear normal.  Left Ear: External ear normal.   Nose: Nose normal.       TMs and canals are clear. Right tonsils is 2+, no exudate.  Left tonsil on mildly swollen and no exudate.    Eyes: Conjunctivae and EOM are normal. Pupils are equal, round, and reactive to light.  Neck: Neck supple. No thyromegaly present.  Cardiovascular: Normal rate, regular rhythm and normal heart sounds.   Pulmonary/Chest: Effort normal and breath sounds normal. She has no wheezes.  Lymphadenopathy:    She has no cervical adenopathy.  Neurological: She  is alert and oriented to person, place, and time.  Skin: Skin is warm and dry.  Psychiatric: She has a normal mood and affect.   Assessment and Plan: 1. Pharyngitis    Unchanged and complicated. Rapid strep neg in office today, already received several doses of Cefdinir. Given IM Ctx x 1 in office today and refer to ENT. Finish course of Cefdinir.

## 2011-04-08 NOTE — Patient Instructions (Signed)
Good to see you. Please stop by to see Marion on your way out. 

## 2011-06-24 ENCOUNTER — Ambulatory Visit (INDEPENDENT_AMBULATORY_CARE_PROVIDER_SITE_OTHER): Payer: BC Managed Care – PPO | Admitting: Family Medicine

## 2011-06-24 ENCOUNTER — Encounter: Payer: Self-pay | Admitting: Family Medicine

## 2011-06-24 VITALS — BP 120/80 | HR 76 | Temp 98.1°F | Wt 188.8 lb

## 2011-06-24 DIAGNOSIS — J019 Acute sinusitis, unspecified: Secondary | ICD-10-CM

## 2011-06-24 MED ORDER — MOXIFLOXACIN HCL 400 MG PO TABS: 400.0000 mg | ORAL_TABLET | Freq: Every day | ORAL | Status: AC

## 2011-06-24 NOTE — Progress Notes (Signed)
SUBJECTIVE:  Christina [name] is a 35 y.o. female who complains of coryza, congestion, post nasal drip, dry cough, headache and bilateral sinus pain for 10 days. She denies a history of chest pain and shortness of breath and denies a history of asthma. Patient denies smoke cigarettes.   PMH significant for PNA. PCN and Azithromycin allergic.  Patient Active Problem List  Diagnoses  . CONJUNCTIVITIS, VIRAL, ACUTE  . SINUSITIS- ACUTE-NOS  . URI  . Pharyngitis  . Fever  . Acute recurrent streptococcal tonsillitis   Past Medical History  Diagnosis Date  . Family planning   . Allergy    Past Surgical History  Procedure Date  . Cesarean section 01/31/2006   History  Substance Use Topics  . Smoking status: Never Smoker   . Smokeless tobacco: Not on file  . Alcohol Use: Not on file   Family History  Problem Relation Age of Onset  . Hypertension Father    Allergies  Allergen Reactions  . Azithromycin     REACTION: [place]  . Penicillins    Current Outpatient Prescriptions on File Prior to Visit  Medication Sig Dispense Refill  . ibuprofen (ADVIL,MOTRIN) 800 MG tablet Take 800 mg by mouth every 8 (eight) hours as needed.        . Norethindrone Acet-Ethinyl Est (LOESTRIN 1.5/30, 21,) 1.5-30 MG-MCG TABS Take 1 tablet by mouth daily.         The PMH, PSH, Social History, Family History, Medications, and allergies have been reviewed in [facility], and have been updated if relevant.  OBJECTIVE: BP 120/80  Pulse 76  Temp(Src) 98.1 F (36.7 C) (Oral)  Wt 188 lb 12 oz (85.616 kg)  LMP 06/12/2011  She appears well, vital signs are as noted. Ears normal.  Throat and pharynx normal.  Neck supple. No adenopathy in the neck. Nose is congested. Frontal sinuses TTP throughout. The chest is clear, without wheezes or rales.  ASSESSMENT:  sinusitis  PLAN: Given duration and progression of symptoms, will treat for bacterial sinusitis with 5 day course of Avelox. Symptomatic therapy suggested:  push fluids, rest and return office visit prn if symptoms persist or worsen.  Call or return to clinic prn if these symptoms worsen or fail to improve as anticipated.

## 2011-06-24 NOTE — Patient Instructions (Signed)
Take antibiotic as directed.  Drink lots of fluids.  Treat sympotmatically with Mucinex, nasal saline irrigation, and Tylenol/Ibuprofen. Also try claritin D or zyrtec D over the counter- two times a day as needed ( have to sign for them at pharmacy). You can use warm compresses.  Cough suppressant at night. Call if not improving as expected in 5-7 days.    

## 2012-01-28 ENCOUNTER — Ambulatory Visit (INDEPENDENT_AMBULATORY_CARE_PROVIDER_SITE_OTHER): Payer: BC Managed Care – PPO | Admitting: Family Medicine

## 2012-01-28 ENCOUNTER — Encounter: Payer: Self-pay | Admitting: Family Medicine

## 2012-01-28 VITALS — BP 120/78 | HR 60 | Temp 97.9°F | Wt 186.0 lb

## 2012-01-28 DIAGNOSIS — J019 Acute sinusitis, unspecified: Secondary | ICD-10-CM

## 2012-01-28 DIAGNOSIS — L259 Unspecified contact dermatitis, unspecified cause: Secondary | ICD-10-CM

## 2012-01-28 DIAGNOSIS — L239 Allergic contact dermatitis, unspecified cause: Secondary | ICD-10-CM | POA: Insufficient documentation

## 2012-01-28 MED ORDER — DOXYCYCLINE HYCLATE 100 MG PO CAPS: 100.0000 mg | ORAL_CAPSULE | Freq: Two times a day (BID) | ORAL | Status: AC

## 2012-01-28 NOTE — Patient Instructions (Signed)
Please restart zyrtec daily- benadryl at night if you would like. You may continue advil cold and sinus.

## 2012-01-28 NOTE — Progress Notes (Signed)
SUBJECTIVE:  Christina [name] is a 36 y.o. female who complains of coryza, congestion, post nasal drip, dry cough, headache and bilateral sinus pain for 10 days. She denies a history of chest pain and shortness of breath and denies a history of asthma. Patient denies smoke cigarettes.   PMH significant for PNA. PCN and Azithromycin allergic.  Also noticed itchy [place] on right arm past few days.  Cortisone cream does help a little. No new soaps or detergents. She did apply sunscreen the day prior.  Patient Active Problem List  Diagnoses  . CONJUNCTIVITIS, VIRAL, ACUTE  . SINUSITIS- ACUTE-NOS  . URI  . Pharyngitis  . Fever  . Acute recurrent streptococcal tonsillitis  . Allergic dermatitis   Past Medical History  Diagnosis Date  . Family planning   . Allergy    Past Surgical History  Procedure Date  . Cesarean section 01/31/2006   History  Substance Use Topics  . Smoking status: Never Smoker   . Smokeless tobacco: Not on file  . Alcohol Use: Not on file   Family History  Problem Relation Age of Onset  . Hypertension Father    Allergies  Allergen Reactions  . Azithromycin     REACTION: [place]  . Penicillins    Current Outpatient Prescriptions on File Prior to Visit  Medication Sig Dispense Refill  . cetirizine (ZYRTEC) 10 MG tablet Take 10 mg by mouth daily.      Marland Kitchen ibuprofen (ADVIL,MOTRIN) 800 MG tablet Take 800 mg by mouth every 8 (eight) hours as needed.        . Norethindrone Acet-Ethinyl Est (LOESTRIN 1.5/30, 21,) 1.5-30 MG-MCG TABS Take 1 tablet by mouth daily.         The PMH, PSH, Social History, Family History, Medications, and allergies have been reviewed in [facility], and have been updated if relevant.  OBJECTIVE: BP 120/78  Pulse 60  Temp 97.9 F (36.6 C)  Wt 186 lb (84.369 kg)  She appears well, vital signs are as noted. Ears normal.  Throat and pharynx normal.  Neck supple. No adenopathy in the neck. Nose is congested. Frontal sinuses TTP throughout. The  chest is clear, without wheezes or rales. Skin:  Faint, elevated macular Anfinson on right arm  ASSESSMENT/PlAN:  1.  sinusitis  Given duration and progression of symptoms, will treat for bacterial sinusitis with doxycyline. Symptomatic therapy suggested: push fluids, rest and return office visit prn if symptoms persist or worsen.  Call or return to clinic prn if these symptoms worsen or fail to improve as anticipated.  2.  Allergic dermatitis- ?possible rxn to sunscreen. Advised using perfume free, dye free sunscreen. Zyrtec/Benadryl as needed. The patient indicates understanding of these issues and agrees with the plan.

## 2012-05-26 ENCOUNTER — Ambulatory Visit (INDEPENDENT_AMBULATORY_CARE_PROVIDER_SITE_OTHER): Payer: BC Managed Care – PPO | Admitting: Family Medicine

## 2012-05-26 ENCOUNTER — Encounter: Payer: Self-pay | Admitting: Family Medicine

## 2012-05-26 VITALS — BP 118/80 | HR 68 | Temp 98.0°F | Wt 188.0 lb

## 2012-05-26 DIAGNOSIS — L255 Unspecified contact dermatitis due to plants, except food: Secondary | ICD-10-CM

## 2012-05-26 MED ORDER — PREDNISONE 20 MG PO TABS: ORAL_TABLET | ORAL | Status: DC

## 2012-05-26 NOTE — Progress Notes (Signed)
  Subjective:    Patient ID: Christina [name], female    DOB: May 27, 1976, 36 y.o.   MRN: [id number]  HPI  Very pleasant 36 yo female here for UC follow up.  Notes reviewed-  Went to [facility]28 after she broke out in very itching [place] on bilateral legs.   [place] consistent with poison ivy/oak- given prednisone taper for 5 days (high dose 30 mg).  [place] is no better.  Weeping.   Afebrile.  She has been keeping it covered and wearing long pants.  She has washed all of her sheets and towels. She is taking Benadryl in evenings which is helping a little with the itching.  No animals in home.  Patient Active Problem List  Diagnosis  . CONJUNCTIVITIS, VIRAL, ACUTE  . SINUSITIS- ACUTE-NOS  . URI  . Pharyngitis  . Fever  . Acute recurrent streptococcal tonsillitis  . Allergic dermatitis   Past Medical History  Diagnosis Date  . Family planning   . Allergy    Past Surgical History  Procedure Date  . Cesarean section 01/31/2006   History  Substance Use Topics  . Smoking status: Never Smoker   . Smokeless tobacco: Not on file  . Alcohol Use: Not on file   Family History  Problem Relation Age of Onset  . Hypertension Father    Allergies  Allergen Reactions  . Azithromycin     REACTION: [place]  . Penicillins    Current Outpatient Prescriptions on File Prior to Visit  Medication Sig Dispense Refill  . cetirizine (ZYRTEC) 10 MG tablet Take 10 mg by mouth daily.      Marland Kitchen ibuprofen (ADVIL,MOTRIN) 800 MG tablet Take 800 mg by mouth every 8 (eight) hours as needed.        . Norethindrone Acet-Ethinyl Est (LOESTRIN 1.5/30, 21,) 1.5-30 MG-MCG TABS Take 1 tablet by mouth daily.         The PMH, PSH, Social History, Family History, Medications, and allergies have been reviewed in [facility], and have been updated if relevant.   Review of Systems    See HPI Objective:   Physical Exam BP 118/80  Pulse 68  Temp 98 F (36.7 C)  Wt 188 lb (85.276 kg) Gen:  Alert, pleasant,  NAD Skin:  Raised, linear Nissan on bilateral lower legs, weeping, no pus or surrounding erythema    Assessment & Plan:   1. Dermatitis due to plants, including poison ivy, sumac, and oak    Insufifcient duration or dose of prednisone- will increase dose and duration of prednisone taper. Continue supportive care outlined in pt. Instructions. Call or return to clinic prn if these symptoms worsen or fail to improve as anticipated. The patient indicates understanding of these issues and agrees with the plan.

## 2012-05-26 NOTE — Patient Instructions (Addendum)
Poison Ivy  Poison ivy is a inflammation of the skin (contact dermatitis) caused by touching the allergens on the leaves of the ivy plant following previous exposure to the plant. The Brashears usually appears 48 hours after exposure. The Sampedro is usually bumps (papules) or blisters (vesicles) in a linear pattern. Depending on your own sensitivity, the Rounds may simply cause redness and itching, or it may also progress to blisters which may break open. These must be well cared for to prevent secondary bacterial (germ) infection, followed by scarring. Keep any open areas dry, clean, dressed, and covered with an antibacterial ointment if needed. The eyes may also get puffy. The puffiness is worst in the morning and gets better as the day progresses. This dermatitis usually heals without scarring, within 2 to 3 weeks without treatment.  HOME CARE INSTRUCTIONS   Thoroughly wash with soap and water as soon as you have been exposed to poison ivy. You have about one half hour to remove the plant resin before it will cause the Mah. This washing will destroy the oil or antigen on the skin that is causing, or will cause, the Rhett. Be sure to wash under your fingernails as any plant resin there will continue to spread the Goatley. Do not rub skin vigorously when washing affected area. Poison ivy cannot spread if no oil from the plant remains on your body. A Hefty that has progressed to weeping sores will not spread the Winterbottom unless you have not washed thoroughly. It is also important to wash any clothes you have been wearing as these may carry active allergens. The Napoleon will return if you wear the unwashed clothing, even several days later.  Avoidance of the plant in the future is the best measure. Poison ivy plant can be recognized by the number of leaves. Generally, poison ivy has three leaves with flowering branches on a single stem.  Diphenhydramine may be purchased over the counter and used as needed for itching. Do not drive with  this medication if it makes you drowsy.Ask your caregiver about medication for children.  SEEK MEDICAL CARE IF:   Open sores develop.   Redness spreads beyond area of [place].   You notice purulent (pus-like) discharge.   You have increased pain.   Other signs of infection develop (such as fever).  Document Released: 08/07/2000 Document Revised: 11/02/2011 Document Reviewed: 06/26/2009  [employer] Patient Information 2013 [employer].

## 2012-05-30 ENCOUNTER — Telehealth: Payer: Self-pay | Admitting: Family Medicine

## 2012-05-30 ENCOUNTER — Ambulatory Visit (INDEPENDENT_AMBULATORY_CARE_PROVIDER_SITE_OTHER): Payer: BC Managed Care – PPO | Admitting: Family Medicine

## 2012-05-30 ENCOUNTER — Encounter: Payer: Self-pay | Admitting: Family Medicine

## 2012-05-30 VITALS — BP 132/90 | HR 80 | Temp 98.4°F | Wt 190.0 lb

## 2012-05-30 DIAGNOSIS — L255 Unspecified contact dermatitis due to plants, except food: Secondary | ICD-10-CM

## 2012-05-30 MED ORDER — PREDNISONE 20 MG PO TABS: ORAL_TABLET | ORAL | Status: DC

## 2012-05-30 NOTE — Telephone Encounter (Signed)
Caller: [name]/Patient; Patient Name: [name], Christina Duster; PCP: Ruthe Mannan [facility]); Best Callback Phone Number: [phone];  Calling regarding red splotchy areas to her back and chest that she noticed 05/29/12. Was in the office on 05/26/12 and started on Prednisone for poison ivy that is located to her legs, states new Gallop looks different and thinks maybe an allergic reaction to the prednisone. Emergent signs and symptoms ruled out as per [facility] protocol except for Call provider in 4 hours due to new onset of Casella after beginning prescribed medication, appt scheduled with Dr. Para March at 3 pm 05/30/12. Has already taken Zyrtec this am.

## 2012-05-30 NOTE — Progress Notes (Signed)
Was seen about 10 days ago for Valvo on legs.  Went to UC, dx'd with poison ivy, started on prednisone, started it 05/21/12.  Recently with recheck by Dr. Dayton Martes, prednisone was increased.  Inc in prednisone helped the itching/redness on the legs.    In the last 24 hours, had some redness/itching on back and chest.  No other trigger known.  The trunk Way doesn't feel similar to the leg Struss.  Chest feels tight but she isn't wheezing or having SOB on walking.  No heartburn.  No vomiting.  No HA.  She can swallow well.  H/o enlarged tonsils prev and this feels similar.  Throat isn't sore.    She has had 5 days of 60mg  prednisone so far.   Meds, vitals, and allergies reviewed.   ROS: See HPI.  Otherwise, noncontributory.  nad ncat Mmm Neck supple, no LA rrr ctab No stridor Legs with irregular nonacute Truman c/w poison ivy Blanching maculopapular red area on the upper chest>upper back

## 2012-05-30 NOTE — Patient Instructions (Addendum)
Take 30mg  of prednisone for 2 days, then 20mg  of prednisone for 2 days, then 10mg  of prednisone for 2 days.  Keep taking the zyrtec and benadryl.  If you get short of breath, then go to the ER.

## 2012-05-31 NOTE — Assessment & Plan Note (Signed)
Resolving, but now with new Boden that only started after prednisone.  This could be from prednisone.  No other trigger known.  Nontoxic.  Would taper pred over the next few days and then f/u prn.  D/w pt.  She agrees.  Notified PCP.

## 2012-06-01 ENCOUNTER — Encounter: Payer: Self-pay | Admitting: Family Medicine

## 2012-06-01 ENCOUNTER — Telehealth: Payer: Self-pay | Admitting: Family Medicine

## 2012-06-01 ENCOUNTER — Ambulatory Visit (INDEPENDENT_AMBULATORY_CARE_PROVIDER_SITE_OTHER): Payer: BC Managed Care – PPO | Admitting: Family Medicine

## 2012-06-01 ENCOUNTER — Ambulatory Visit: Payer: BC Managed Care – PPO | Admitting: Family Medicine

## 2012-06-01 VITALS — BP 120/80 | HR 80 | Temp 98.5°F | Wt 190.8 lb

## 2012-06-01 DIAGNOSIS — L255 Unspecified contact dermatitis due to plants, except food: Secondary | ICD-10-CM

## 2012-06-01 NOTE — Telephone Encounter (Signed)
She does need to be seen but unfortunately I am not here this afternoon.

## 2012-06-01 NOTE — Telephone Encounter (Signed)
Caller: Jonette/Patient; Patient Name: Christina [name], Christina [name]; PCP: Ruthe Mannan  [facility]); Best Callback Phone Number: [phone]; Reason for call: Chest tightness rated at 5-6 of 10.  Shortness of breath this am when walking from car to office.  ; on Prednisone since 05/21/12 and was advised to call if this happened.  Dose increase on 05/26/12.  LMP 9/3 or 11/2011.  Constant pressure onset estimated 08:30.  "A little lightheadedness when standing; it eases up when I sit back down."  Emergent symptoms ruled out.  Home care for the interim and see provider in 24 hours per Chest Pain protocol and nursing judgment.   Caller states she has not taken this am's dose of Prednisone -" in case this is an allergic reaction."  Appointment not scheduled at this time; caller states preference to see PCP or Dr. Para March.  States it will take her 30 minutes to get to office from work; no 30 minute appointments seen on schedule.  Note to office to follow up with patient regarding appointment time.

## 2012-06-01 NOTE — Patient Instructions (Signed)
Stop the prednisone and continue the benadryl.  This should get better. Take care.

## 2012-06-01 NOTE — Telephone Encounter (Signed)
Appt scheduled with Dr. Para March.

## 2012-06-01 NOTE — Progress Notes (Signed)
Last OV note reviewed.  Was doing well yesterday but had the sensation of SOB, needing to take deep breaths this AM.  She was out of breath after walking into her office from parking lot. Gets SOB with exercise.  This is new.  No wheeze.  Is on 30mg  of prednisone now, plan to start 20mg  tomorrow. Balogh on upper chest is still present, itchy, some better with benadryl.  Breathing now is better, at time of OV. No heartburn.  Jittery prev on prednisone prev. No muscle weakness.  If SOB, then improved with rest.  She was minimally SOB walking in from the parking lot this afternoon, better with rest.    No FCNAVD, no cough.    Meds, vitals, and allergies reviewed.   ROS: See HPI.  Otherwise, noncontributory.  nad ncat Mmm Neck supple, no stridor rrr ctab abd soft, not ttp.  Legs with irregular nonacute Mcquinn c/w poison ivy  Blanching maculopapular red area on the upper chest>upper back

## 2012-06-02 NOTE — Assessment & Plan Note (Signed)
Resolving, and now with symptoms suggestive to prednisone intolerance. I walked pt in clinic w/o hypoxia on pulse ox.  Still ctab after walking.  Okay for outpatient f/u.  Would stop prednisone, continue benadryl.  Didn't start SABA due to lack of wheeze.  This should resolve.  F/u prn.  She agrees.

## 2012-10-13 ENCOUNTER — Ambulatory Visit (INDEPENDENT_AMBULATORY_CARE_PROVIDER_SITE_OTHER): Payer: BC Managed Care – PPO | Admitting: Family Medicine

## 2012-10-13 ENCOUNTER — Encounter: Payer: Self-pay | Admitting: Family Medicine

## 2012-10-13 VITALS — BP 120/80 | HR 86 | Temp 98.0°F | Wt 193.0 lb

## 2012-10-13 DIAGNOSIS — N649 Disorder of breast, unspecified: Secondary | ICD-10-CM | POA: Insufficient documentation

## 2012-10-13 MED ORDER — NYSTATIN-TRIAMCINOLONE 100000-0.1 UNIT/GM-% EX OINT: TOPICAL_OINTMENT | Freq: Two times a day (BID) | CUTANEOUS | Status: DC

## 2012-10-13 NOTE — Patient Instructions (Addendum)
Good to see you. Let's try the mycolog to area twice daily- call me in 1 week or so with an update.  Sooner if anything changes.  Please stop by to see Shirlee Limerick- she will set up your mammogram.  I will call you as soon as I get your results.

## 2012-10-13 NOTE — Addendum Note (Signed)
Addended by: Dianne Dun on: 10/13/2012 10:07 AM   Modules accepted: Orders

## 2012-10-13 NOTE — Progress Notes (Signed)
Subjective:    Patient ID: Christina [name], female    DOB: [birth year], 37 y.o.   MRN: [id number]  HPI  Very pleasant 37 yo female here for left breast lesion.  Noticed it 4 days ago- felt like a blister.  It is tender to touch.  Not draining anything.  No fever. No trauma to breast.  Maybe a little itchy but not much.  Never had anything like this before.  Paternal grandmother had breast CA at a young age.  Patient Active Problem List  Diagnosis  . Breast lesion   Past Medical History  Diagnosis Date  . Family planning   . Allergy    Past Surgical History  Procedure Laterality Date  . Cesarean section  01/31/2006   History  Substance Use Topics  . Smoking status: Never Smoker   . Smokeless tobacco: Never Used  . Alcohol Use: Yes     Comment: rarely   Family History  Problem Relation Age of Onset  . Hypertension Father    Allergies  Allergen Reactions  . Azithromycin     REACTION: [place]ho  . Penicillins     [place]  . Prednisone     [place] and short of breath   Current Outpatient Prescriptions on File Prior to Visit  Medication Sig Dispense Refill  . cetirizine (ZYRTEC) 10 MG tablet Take 10 mg by mouth daily.      . diphenhydrAMINE (BENADRYL) 25 MG tablet Take 25 mg by mouth at bedtime as needed.      . Norethindrone Acet-Ethinyl Est (LOESTRIN 1.5/30, 21,) 1.5-30 MG-MCG TABS Take 1 tablet by mouth daily.         No current facility-administered medications on file prior to visit.   The PMH, PSH, Social History, Family History, Medications, and allergies have been reviewed in [facility], and have been updated if relevant.      Review of Systems See HPI No fevers    Objective:   Physical Exam BP 120/80  Pulse 86  Temp(Src) 98 F (36.7 C)  Wt 193 lb (87.544 kg)  BMI 30.69 kg/m2  General:  Well-developed,well-nourished,in no acute distress; alert,appropriate and cooperative throughout examination Head:  normocephalic and atraumatic.   Eyes:  vision grossly  intact, pupils equal, pupils round, and pupils reactive to light.   Ears:  R ear normal and L ear normal.   Nose:  no external deformity.   Mouth:  good dentition.   Neck:  No deformities, masses, or tenderness noted. Breasts:  No mass, nodules,  bulging, retraction, or nipple discharge. Circular pink lesion with raised borders lateral to left nipple, no drainage, it is tender to palpation Abdomen:  Bowel sounds positive,abdomen soft and non-tender without masses, organomegaly or hernias noted. Msk:  No deformity or scoliosis noted of thoracic or lumbar spine.   Extremities:  No clubbing, cyanosis, edema, or deformity noted with normal full range of motion of all joints.   Neurologic:  alert & oriented X3 and gait normal.   Skin:  See breast exam Axillary Nodes:  No palpable lymphadenopathy Psych:  Cognition and judgment appear intact. Alert and cooperative with normal attention span and concentration. No apparent delusions, illusions, hallucinations     Assessment & Plan:  1. Breast lesion New- does almost have a ring worm appearance- seems dermal.  No indication of abscess but because she is tender, will refer for mammogram/ultrasound. Topical mycolog for 7 - 10 days (allergy to oral pred but has not had adverse rxn to  topical steroids). - MM Digital Diagnostic Unilat L; Future

## 2012-10-24 ENCOUNTER — Ambulatory Visit
Admission: RE | Admit: 2012-10-24 | Discharge: 2012-10-24 | Disposition: A | Discharge status: Home / Self Care | Payer: BC Managed Care – PPO | Source: Ambulatory Visit | Attending: Family Medicine | Admitting: Family Medicine

## 2012-10-24 DIAGNOSIS — N649 Disorder of breast, unspecified: Secondary | ICD-10-CM

## 2012-11-01 ENCOUNTER — Encounter: Payer: Self-pay | Admitting: Family Medicine

## 2012-11-01 ENCOUNTER — Ambulatory Visit (INDEPENDENT_AMBULATORY_CARE_PROVIDER_SITE_OTHER): Payer: BC Managed Care – PPO | Admitting: Family Medicine

## 2012-11-01 VITALS — BP 104/70 | HR 68 | Temp 98.1°F | Wt 193.0 lb

## 2012-11-01 DIAGNOSIS — N649 Disorder of breast, unspecified: Secondary | ICD-10-CM

## 2012-11-01 MED ORDER — CLOTRIMAZOLE 1 % EX CREA: TOPICAL_CREAM | Freq: Two times a day (BID) | CUTANEOUS | Status: DC

## 2012-11-01 NOTE — Patient Instructions (Addendum)
Good to see you. Please apply lotrimin cream twice daily to area for 10 days. Stop by to see Shirlee Limerick on your way out to set up your dermatology appointment.  Please call me in 10 days with an update.

## 2012-11-01 NOTE — Progress Notes (Signed)
Subjective:    Patient ID: Christina [name], female    DOB: 20-Dec-1975, 37 y.o.   MRN: [id number]  HPI  Very pleasant 37 yo female here for follow up left breast lesion.  Initially saw her for this one month ago- felt like a blister.  It was tender to touch.  Not draining anything.  No fever. No trauma to breast.  Maybe a little itchy but not much.  Never had anything like this before.  Ordered mammogram which was neg.  Mm Digital Diagnostic Bilat  10/24/2012  *RADIOLOGY REPORT*  Clinical Data:  The patient is being treated for ring worm of the skin of the left breast.  There is a ring-shaped erythematous [place] at approximately 2 o'clock in the left areolar border.  She has some pain in this area in the breast.  DIGITAL DIAGNOSTIC BILATERAL MAMMOGRAM WITH CAD  Comparison: None.  Findings:  ACR Breast Density Category 2: There are scattered fibroglandular densities.  There is no dominant mass, architectural distortion or calcification to suggest malignancy.  Mammographic images were processed with CAD.  IMPRESSION: No mammographic evidence of malignancy.  RECOMMENDATION: Yearly screening mammography should begin at age 37 unless clinically indicated earlier.  I have discussed the findings and recommendations with the patient. Results were also provided in writing at the conclusion of the visit.  BI-RADS CATEGORY 1:  Negative.   Original Report Authenticated By: Cain Saupe, M.D.    Did have almost have a ring worm appearance- seemed dermal.   Given rx for topical mycolog for 7 - 10 days (allergy to oral pred but has not had adverse rxn to topical steroids). Mycolog has helped a little but not much.  Still itchy, area seems wider but less red.    Patient Active Problem List  Diagnosis  . Breast lesion   Past Medical History  Diagnosis Date  . Family planning   . Allergy    Past Surgical History  Procedure Laterality Date  . Cesarean section  01/31/2006   History  Substance Use  Topics  . Smoking status: Never Smoker   . Smokeless tobacco: Never Used  . Alcohol Use: Yes     Comment: rarely   Family History  Problem Relation Age of Onset  . Hypertension Father    Allergies  Allergen Reactions  . Azithromycin     REACTION: [place]  . Penicillins     [place]  . Prednisone     [place] and short of breath   Current Outpatient Prescriptions on File Prior to Visit  Medication Sig Dispense Refill  . cetirizine (ZYRTEC) 10 MG tablet Take 10 mg by mouth daily.      . diphenhydrAMINE (BENADRYL) 25 MG tablet Take 25 mg by mouth at bedtime as needed.      . Norethindrone Acet-Ethinyl Est (LOESTRIN 1.5/30, 21,) 1.5-30 MG-MCG TABS Take 1 tablet by mouth daily.        Marland Kitchen nystatin-triamcinolone ointment (MYCOLOG) Apply topically 2 (two) times daily.  30 g  0   No current facility-administered medications on file prior to visit.   The PMH, PSH, Social History, Family History, Medications, and allergies have been reviewed in [facility], and have been updated if relevant.      Review of Systems See HPI No fevers    Objective:   Physical Exam BP 104/70  Pulse 68  Temp(Src) 98.1 F (36.7 C)  Wt 193 lb (87.544 kg)  BMI 30.69 kg/m2  General:  Well-developed,well-nourished,in no acute distress;  alert,appropriate and cooperative throughout examination Head:  normocephalic and atraumatic.   Eyes:  vision grossly intact, pupils equal, pupils round, and pupils reactive to light.   Ears:  R ear normal and L ear normal.   Nose:  no external deformity.   Mouth:  good dentition.   Neck:  No deformities, masses, or tenderness noted. Breasts:  No mass, nodules,  bulging, retraction, or nipple discharge. Circular pink lesion with raised borders lateral to left nipple, larger, no drainage, it is less tender to palpation Abdomen:  Bowel sounds positive,abdomen soft and non-tender without masses, organomegaly or hernias noted. Msk:  No deformity or scoliosis noted of thoracic or lumbar  spine.   Extremities:  No clubbing, cyanosis, edema, or deformity noted with normal full range of motion of all joints.   Neurologic:  alert & oriented X3 and gait normal.   Skin:  See breast exam Axillary Nodes:  No palpable lymphadenopathy Psych:  Cognition and judgment appear intact. Alert and cooperative with normal attention span and concentration. No apparent delusions, illusions, hallucinations     Assessment & Plan:  1. Breast lesion Still has a ringworm appearance at this point. Will treat with topical lotrimin 1% cream twice daily x 10 days.  Refer to derm for further evaluation and treatment. The patient indicates understanding of these issues and agrees with the plan.

## 2012-11-02 ENCOUNTER — Encounter: Payer: Self-pay | Admitting: Family Medicine

## 2012-11-17 ENCOUNTER — Telehealth: Payer: Self-pay | Admitting: Family Medicine

## 2012-11-17 NOTE — Telephone Encounter (Signed)
Caller: Christina [name]/Patient; Phone: [phone]; Reason for Call: Patient calling in follow up regarding ringworm on left breast.  States using cream prescribed by Dr.  Dayton Martes two weeks ago, and the area is improving.  The cream is being used BID.  Advised to continue using the cream until the Martis is entirely gone x 7 days.  Has appt scheduled with dermatologist, "as a backup, " and will wait until lesion is completely cleared before cancelling that appt.  No further questions or concerns; info to office.  Krs/can

## 2013-04-19 ENCOUNTER — Ambulatory Visit (INDEPENDENT_AMBULATORY_CARE_PROVIDER_SITE_OTHER): Payer: BC Managed Care – PPO | Admitting: Family Medicine

## 2013-04-19 ENCOUNTER — Encounter: Payer: Self-pay | Admitting: Family Medicine

## 2013-04-19 VITALS — BP 120/84 | HR 72 | Temp 98.0°F | Ht 65.75 in | Wt 195.0 lb

## 2013-04-19 DIAGNOSIS — B354 Tinea corporis: Secondary | ICD-10-CM

## 2013-04-19 MED ORDER — MICONAZOLE NITRATE 2 % EX CREA: TOPICAL_CREAM | Freq: Two times a day (BID) | CUTANEOUS | Status: DC

## 2013-04-19 NOTE — Patient Instructions (Addendum)
Good to see you, Marcelino Duster. Please call me with an update next week.

## 2013-04-19 NOTE — Progress Notes (Signed)
  Subjective:    Patient ID: Christina [name], female    DOB: 11/04/1975, 37 y.o.   MRN: [id number]  HPI  37 yo here for ? Ringworm.  Had lesion on her right arm- using lotrimin with minimal improvement.  Woke up with morning with similar lesion on right neck. It is itchy.  No family members have similar symptoms.  Patient Active Problem List   Diagnosis Date Noted  . Ringworm of body 04/19/2013   Past Medical History  Diagnosis Date  . Family planning   . Allergy    Past Surgical History  Procedure Laterality Date  . Cesarean section  01/31/2006   History  Substance Use Topics  . Smoking status: Never Smoker   . Smokeless tobacco: Never Used  . Alcohol Use: Yes     Comment: rarely   Family History  Problem Relation Age of Onset  . Hypertension Father    Allergies  Allergen Reactions  . Azithromycin     REACTION: Capo[place]  . Penicillins     [place]  . Prednisone     [place] and short of breath   Current Outpatient Prescriptions on File Prior to Visit  Medication Sig Dispense Refill  . cetirizine (ZYRTEC) 10 MG tablet Take 10 mg by mouth daily.      . clotrimazole (LOTRIMIN) 1 % cream Apply topically 2 (two) times daily.  30 g  0  . diphenhydrAMINE (BENADRYL) 25 MG tablet Take 25 mg by mouth at bedtime as needed.      . Norethindrone Acet-Ethinyl Est (LOESTRIN 1.5/30, 21,) 1.5-30 MG-MCG TABS Take 1 tablet by mouth daily.         No current facility-administered medications on file prior to visit.   The PMH, PSH, Social History, Family History, Medications, and allergies have been reviewed in [facility], and have been updated if relevant.   Review of Systems See HPI    Objective:   Physical Exam BP 120/84  Pulse 72  Temp(Src) 98 F (36.7 C)  Ht 5' 5.75" (1.67 m)  Wt 195 lb (88.451 kg)  BMI 31.72 kg/m2 Gen:  Alert, pleasant, NAD Skin: Right upper arm- small, faint circular erythematous lesions with raised borders and central clearing.  Similar lesion on right neck.     Assessment & Plan:  1. Ringworm of body New. D/c lotrimin.  Try topical miconazole. If no improvement, consider oral antifungal. The patient indicates understanding of these issues and agrees with the plan.

## 2013-04-26 ENCOUNTER — Telehealth: Payer: Self-pay

## 2013-04-26 DIAGNOSIS — R21 Rash and other nonspecific skin eruption: Secondary | ICD-10-CM

## 2013-04-26 MED ORDER — TERBINAFINE HCL 250 MG PO TABS: ORAL_TABLET | ORAL | Status: DC

## 2013-04-26 NOTE — Telephone Encounter (Signed)
Pt left v/m; was seen on 04/19/13 for ring worm on arm and neck; pt has been using cream and ring worm spots on arm are scabbing over and slowly disappearing. Spot on neck is no better; pt wanted to ck on oral med being sent to [facility] on [address]. Pt request cb.

## 2013-04-26 NOTE — Telephone Encounter (Signed)
Advised patient, lab appt scheduled.

## 2013-04-26 NOTE — Telephone Encounter (Signed)
Rx sent.  Please make sure she comes in for liver function tests in 2 weeks.

## 2013-05-09 NOTE — Addendum Note (Signed)
Addended by: Dianne Dun on: 05/09/2013 04:27 PM   Modules accepted: Orders

## 2013-05-09 NOTE — Telephone Encounter (Addendum)
Pt left v/m; pt will finish oral med tonight; pt said no improvement in ringworm and appears new areas of ringworm have formed. Pt has lab appt 05/10/13 at 7:50 am. Pt wants to know if needs more med or should be rechecked.Please advise.[facility] [address].

## 2013-05-09 NOTE — Telephone Encounter (Signed)
Advised patient.  She will keep lab appt for tomorrow.

## 2013-05-09 NOTE — Telephone Encounter (Signed)
Let's go ahead and refer to a dermatologist considering that she has now failed oral and topical treatment.  Referral placed.

## 2013-05-10 ENCOUNTER — Other Ambulatory Visit (INDEPENDENT_AMBULATORY_CARE_PROVIDER_SITE_OTHER): Payer: BC Managed Care – PPO

## 2013-05-10 DIAGNOSIS — B354 Tinea corporis: Secondary | ICD-10-CM

## 2013-05-10 DIAGNOSIS — Z79899 Other long term (current) drug therapy: Secondary | ICD-10-CM

## 2013-05-10 LAB — HEPATIC FUNCTION PANEL
Albumin: 3.8 g/dL (ref 3.5–5.2)
Alkaline Phosphatase: 63 U/L (ref 39–117)
Total Protein: 7.2 g/dL (ref 6.0–8.3)

## 2013-05-11 ENCOUNTER — Encounter: Payer: Self-pay | Admitting: *Deleted

## 2013-06-13 ENCOUNTER — Encounter: Payer: Self-pay | Admitting: Internal Medicine

## 2013-06-13 ENCOUNTER — Ambulatory Visit (INDEPENDENT_AMBULATORY_CARE_PROVIDER_SITE_OTHER): Payer: BC Managed Care – PPO | Admitting: Internal Medicine

## 2013-06-13 VITALS — BP 128/70 | HR 96 | Temp 98.2°F | Wt 197.0 lb

## 2013-06-13 DIAGNOSIS — J039 Acute tonsillitis, unspecified: Secondary | ICD-10-CM

## 2013-06-13 LAB — POCT RAPID STREP A (OFFICE): Rapid Strep A Screen: POSITIVE — AB

## 2013-06-13 MED ORDER — CEPHALEXIN 500 MG PO CAPS: 500.0000 mg | ORAL_CAPSULE | Freq: Three times a day (TID) | ORAL | Status: DC

## 2013-06-13 NOTE — Assessment & Plan Note (Signed)
Rapid strep positive Will treat with keflex analgesics

## 2013-06-13 NOTE — Progress Notes (Signed)
  Subjective:    Patient ID: Christina [name], female    DOB: 1976-03-31, 37 y.o.   MRN: [id number]  HPI Having bad sore throat Tonsils are swollen Started yesterday but much worse today Kept her up last night advil some help  Temp 99 during night No sweats No cough Cold a couple of weeks ago but not now No ear pain  Able to swallow but hurts a lot No known strep exposure 72 year old daughter with mild cold  Current Outpatient Prescriptions on File Prior to Visit  Medication Sig Dispense Refill  . cetirizine (ZYRTEC) 10 MG tablet Take 10 mg by mouth daily.      . diphenhydrAMINE (BENADRYL) 25 MG tablet Take 25 mg by mouth at bedtime as needed.      . Norethindrone Acet-Ethinyl Est (LOESTRIN 1.5/30, 21,) 1.5-30 MG-MCG TABS Take 1 tablet by mouth daily.         No current facility-administered medications on file prior to visit.    Allergies  Allergen Reactions  . Azithromycin     REACTION: [place]  . Penicillins     [place]  . Prednisone     [place] and short of breath    Past Medical History  Diagnosis Date  . Family planning   . Allergy     Past Surgical History  Procedure Laterality Date  . Cesarean section  01/31/2006    Family History  Problem Relation Age of Onset  . Hypertension Father     History   Social History  . Marital Status: Married    Spouse Name: N/A    Number of Children: 1  . Years of Education: N/A   Occupational History  . Working for [employer] firm    Social History Main Topics  . Smoking status: Never Smoker   . Smokeless tobacco: Never Used  . Alcohol Use: Yes     Comment: rarely  . Drug Use: No  . Sexual Activity: Not on file   Other Topics Concern  . Not on file   Social History Narrative  . No narrative on file   Review of Systems No [place] Some nausea and decreased appetite    Objective:   Physical Exam  Constitutional: She appears well-developed and well-nourished. No distress.  HENT:  Slight maxillary tenderness TMs  normal 2-3+ tonsils. Moderate injection but no exudates  Neck: Normal range of motion. Neck supple.  Pulmonary/Chest: Effort normal and breath sounds normal. No respiratory distress. She has no wheezes. She has no rales.  Lymphadenopathy:    She has no cervical adenopathy.  Skin: No [place] noted.          Assessment & Plan:

## 2013-07-07 ENCOUNTER — Encounter: Payer: Self-pay | Admitting: Internal Medicine

## 2013-07-07 ENCOUNTER — Ambulatory Visit (INDEPENDENT_AMBULATORY_CARE_PROVIDER_SITE_OTHER): Payer: BC Managed Care – PPO | Admitting: Internal Medicine

## 2013-07-07 VITALS — BP 120/80 | HR 80 | Temp 98.4°F | Wt 199.0 lb

## 2013-07-07 DIAGNOSIS — J019 Acute sinusitis, unspecified: Secondary | ICD-10-CM

## 2013-07-07 MED ORDER — DOXYCYCLINE HYCLATE 100 MG PO TABS: 100.0000 mg | ORAL_TABLET | Freq: Two times a day (BID) | ORAL | Status: DC

## 2013-07-07 NOTE — Patient Instructions (Signed)

## 2013-07-07 NOTE — Progress Notes (Signed)
Pre-visit discussion using our clinic review tool. No additional management support is needed unless otherwise documented below in the visit note.  

## 2013-07-07 NOTE — Progress Notes (Signed)
HPI  Pt presents to the clinic today with c/o sinus pain, yellow/green nasal discharge. She has sinus infections in the past. She denies, fever chills and body aches. She does have a history of allergies and does take zyrtec daily. She is a non smoker.  Review of Systems    Past Medical History  Diagnosis Date  . Family planning   . Allergy     Family History  Problem Relation Age of Onset  . Hypertension Father     History   Social History  . Marital Status: Married    Spouse Name: N/A    Number of Children: 1  . Years of Education: N/A   Occupational History  . Working for [employer] firm    Social History Main Topics  . Smoking status: Never Smoker   . Smokeless tobacco: Never Used  . Alcohol Use: Yes     Comment: rarely  . Drug Use: No  . Sexual Activity: Not on file   Other Topics Concern  . Not on file   Social History Narrative  . No narrative on file    Allergies  Allergen Reactions  . Azithromycin     REACTION: [place]  . Penicillins     [place]  . Prednisone     [place] and short of breath     Constitutional: Positive headache, fatigue. Denies fever or abrupt weight changes.  HEENT:  Positive eye pain, pressure behind the eyes, facial pain, nasal congestion and sore throat. Denies eye redness, ear pain, ringing in the ears, wax buildup, runny nose or bloody nose. Respiratory: Positive cough and thick green sputum production. Denies difficulty breathing or shortness of breath.  Cardiovascular: Denies chest pain, chest tightness, palpitations or swelling in the hands or feet.   No other specific complaints in a complete review of systems (except as listed in HPI above).  Objective:    BP 120/80  Pulse 80  Temp(Src) 98.4 F (36.9 C) (Tympanic)  Wt 199 lb (90.266 kg)  SpO2 98% Wt Readings from Last 3 Encounters:  07/07/13 199 lb (90.266 kg)  06/13/13 197 lb (89.359 kg)  04/19/13 195 lb (88.451 kg)    General: Appears her stated age, well developed,  well nourished in NAD. HEENT: Head: normal shape and size, frontal sinuses tender to palpation; Eyes: sclera white, no icterus, conjunctiva pink, PERRLA and EOMs intact; Ears: Tm's gray and intact, normal light reflex; Nose: mucosa pink and moist, septum midline; Throat/Mouth: + PND. Teeth present, mucosa pink and moist, no exudate noted, no lesions or ulcerations noted.  Neck: Mild cervical lymphadenopathy. Neck supple, trachea midline. No massses, lumps or thyromegaly present.  Cardiovascular: Normal rate and rhythm. S1,S2 noted.  No murmur, rubs or gallops noted. No JVD or BLE edema. No carotid bruits noted. Pulmonary/Chest: Normal effort and positive vesicular breath sounds. No respiratory distress. No wheezes, rales or ronchi noted.      Assessment & Plan:   Acute bacterial sinusitis  Can use a Neti Pot which can be purchased from your local drug store. Flonase 2 sprays each nostril for 3 days and then as needed. Doxy BID for 10 days  RTC as needed or if symptoms persist.

## 2013-12-18 ENCOUNTER — Ambulatory Visit (HOSPITAL_COMMUNITY): Payer: BC Managed Care – PPO | Attending: Family Medicine | Admitting: Cardiology

## 2013-12-18 ENCOUNTER — Telehealth: Payer: Self-pay | Admitting: *Deleted

## 2013-12-18 ENCOUNTER — Encounter: Payer: Self-pay | Admitting: Family Medicine

## 2013-12-18 ENCOUNTER — Other Ambulatory Visit (HOSPITAL_COMMUNITY): Payer: Self-pay | Admitting: Cardiology

## 2013-12-18 ENCOUNTER — Ambulatory Visit (INDEPENDENT_AMBULATORY_CARE_PROVIDER_SITE_OTHER): Payer: BC Managed Care – PPO | Admitting: Family Medicine

## 2013-12-18 VITALS — BP 118/78 | HR 73 | Temp 97.9°F | Ht 65.75 in | Wt 197.8 lb

## 2013-12-18 DIAGNOSIS — M7989 Other specified soft tissue disorders: Secondary | ICD-10-CM

## 2013-12-18 DIAGNOSIS — M79609 Pain in unspecified limb: Secondary | ICD-10-CM | POA: Insufficient documentation

## 2013-12-18 NOTE — Progress Notes (Signed)
Lower venous duplex limited completed 

## 2013-12-18 NOTE — Patient Instructions (Signed)
Please go to front desk and let them know you need to set up a referral or they will call if you if this is not an urgent referral.  Either MARION or LINDA will help you set it up.   I will call you as soon as I get the results.

## 2013-12-18 NOTE — Telephone Encounter (Signed)
Message copied by Modena Nunnery on Mon Dec 18, 2013 12:57 PM ------      Message from: Lucille Passy      Created: Mon Dec 18, 2013 12:33 PM      Regarding: FW: prelim       Please call pt to let her know that she does not have a blood clot. Likely just a hematoma which is great news.  Keep [place] updated with symptoms.       ----- Message -----         From: Illene Silver         Sent: 12/18/2013  12:32 PM           To: Lucille Passy, MD      Subject: prelim                                                   Right lower leg venous duplex appears negative for Acute thrombus.       Thank you        ------

## 2013-12-18 NOTE — Progress Notes (Signed)
   Subjective:   Patient ID: Christina [name], female    DOB: [birth year], 38 y.o.   MRN: [id number]  Christina [name] is a pleasant 38 y.o. year old female who presents to clinic today with Leg Pain  on 12/18/2013  HPI: Right lower leg pain.  Several days ago, was in a [employer] class with her daughter.  She accidentally kicked her on her right lower leg.  It hurt but did not have any pain with weight bearing.  There was immediate swelling and redness in the area.  Swelling is not bigger but not going away.  Also now warmth around the area.  She has now recently also developed pain with weight bearing.  She is on OCPs.  Denies any CP or SOB.  Patient Active Problem List   Diagnosis Date Noted  . Right leg swelling 12/18/2013   Past Medical History  Diagnosis Date  . Family planning   . Allergy    Past Surgical History  Procedure Laterality Date  . Cesarean section  01/31/2006   History  Substance Use Topics  . Smoking status: Never Smoker   . Smokeless tobacco: Never Used  . Alcohol Use: Yes     Comment: rarely   Family History  Problem Relation Age of Onset  . Hypertension Father    Allergies  Allergen Reactions  . Azithromycin     REACTION: Zheng  . Penicillins     Kirschenmann  . Prednisone     Barcenas and short of breath   Current Outpatient Prescriptions on File Prior to Visit  Medication Sig Dispense Refill  . diphenhydrAMINE (BENADRYL) 25 MG tablet Take 25 mg by mouth at bedtime as needed.      . Norethindrone Acet-Ethinyl Est (LOESTRIN 1.5/30, 21,) 1.5-30 MG-MCG TABS Take 1 tablet by mouth daily.         No current facility-administered medications on file prior to visit.   The PMH, PSH, Social History, Family History, Medications, and allergies have been reviewed in [facility], and have been updated if relevant.   Review of Systems + mild tingling in right toes No LE weakness    Objective:    BP 118/78  Pulse 73  Temp(Src) 97.9 F (36.6 C) (Oral)  Ht 5' 5.75"  (1.67 m)  Wt 197 lb 12 oz (89.699 kg)  BMI 32.16 kg/m2  SpO2 97%  LMP 10/23/2013   Physical Exam  Gen:  Alert, pleasant, NAD MSK: Right leg- 3 cm x 2 cm raised area, TTP with some erythema and warmth Normal pedal pulses Psych:  Good eye contact Not anxious or depressed appearing      Assessment & Plan:   Right leg swelling - Plan: Lower Extremity Venous Duplex Right No Follow-up on file.

## 2013-12-18 NOTE — Progress Notes (Signed)
Pre visit review using our clinic review tool, if applicable. No additional management support is needed unless otherwise documented below in the visit note. 

## 2013-12-18 NOTE — Assessment & Plan Note (Signed)
Since she did not have pain initially with weight bearing, I am less concerned for fracture at this point. I would like to rule out DVT given change of symptoms as well on physical exam and her current use of OCPs. Advised to continue Ibuprofen and I will call her doppler results.  She is aware to notify us or go to ER if symptoms worsen or change. The patient indicates understanding of these issues and agrees with the plan.

## 2013-12-18 NOTE — Telephone Encounter (Signed)
Spoke to pt and advised per Dr Aron.  

## 2014-06-26 ENCOUNTER — Encounter: Payer: Self-pay | Admitting: Internal Medicine

## 2014-06-26 ENCOUNTER — Ambulatory Visit (INDEPENDENT_AMBULATORY_CARE_PROVIDER_SITE_OTHER)
Admission: RE | Admit: 2014-06-26 | Discharge: 2014-06-26 | Disposition: A | Discharge status: Home / Self Care | Payer: BC Managed Care – PPO | Source: Ambulatory Visit | Attending: Internal Medicine | Admitting: Internal Medicine

## 2014-06-26 ENCOUNTER — Ambulatory Visit (INDEPENDENT_AMBULATORY_CARE_PROVIDER_SITE_OTHER): Payer: BC Managed Care – PPO | Admitting: Internal Medicine

## 2014-06-26 VITALS — BP 122/84 | HR 76 | Temp 97.9°F | Wt 196.0 lb

## 2014-06-26 DIAGNOSIS — M79661 Pain in right lower leg: Secondary | ICD-10-CM

## 2014-06-26 NOTE — Patient Instructions (Signed)
Medial Tibial Stress Syndrome (Shin Splints) with Rehab Medial tibial stress syndrome is also called shin splints. Shin splints is a term that is broadly used to describe pain in the lower leg. Shin splints most commonly involve inflammation of the bone lining (periostitis). SYMPTOMS   Pain in the front, or more commonly, the inner part of the lower half of the leg (shin), above the ankle.  Pain that first occurs after exercise, and eventually progresses to pain at the beginning of exercise, that decreases after a short warm up period.  With continued exercise and if left untreated, constant pain that eventually causes the athlete to stop playing sports. CAUSES  Shin splints are an overuse injury, in which the bone lining (periosteum) is broken down at a faster rate than it can be repaired. This leads to inflammation of the periosteum and pain.  RISK INCREASES WITH:  Weakness or imbalance of the muscles of the leg and calf.  Poor strength and flexibility. Failure to warm up properly before activity.  Sports that require repetitive loading or running (marathon running, soccer, walking, jogging), especially on uneven ground or hard surfaces (concrete).  Lack of conditioning, early in the season or practice.  Poor running technique.  Flat feet.  Sudden change in activity intensity, frequency, or duration. PREVENTION  Warm up and stretch properly before activity.  Allow for adequate recovery between workouts.  Maintain physical fitness:  Strength, flexibility, and endurance.  Cardiovascular fitness.  Ensure properly fitted and cushioned shoes.  Wear cushioned arch supports.  Learn and use proper technique and have a coach correct improper technique.  Increase activity gradually.  Run on surfaces that absorb shock, such as grass, composite track, or sand (beach). PROGNOSIS  If treated properly with a slow return to activity, shin splints usually heal within 2 to 8 weeks.    RELATED COMPLICATIONS   Recurring symptoms, that result in a chronic problem.  Longer healing time, if not properly treated or if not given enough time to heal.  Altered level of performance or need to end sports participation, due to pain if activity is continued without treatment. TREATMENT Treatment first involves the use of ice and medicine, to reduce pain and inflammation. The use of strengthening and stretching exercises may help reduce pain with activity. These exercises may be performed at home or with a therapist. For individuals with flat feet, the use of arch supports (orthotics) may be helpful. Sometimes, taping, casting, or bracing the leg may be advised. Slow return to activity is allowed after pain is gone. Rarely, surgery is attempted to remove the chronically inflamed tissue.  MEDICATION  If pain medicine is needed, nonsteroidal anti-inflammatory medicines (aspirin and ibuprofen), or other minor pain relievers (acetaminophen), are often advised.  Do not take pain medicine for 7 days before surgery.  Prescription pain relievers may be given, if your caregiver thinks they are needed. Use only as directed and only as much as you need.  Ointments applied to the skin may be helpful. HEAT AND COLD  Cold treatment (icing) should be applied for 10 to 15 minutes every 2 to 3 hours for inflammation and pain, and immediately after activity that aggravates your symptoms. Use ice packs or an ice massage.  Heat treatment may be used before performing stretching and strengthening activities prescribed by your caregiver, physical therapist, or athletic trainer. Use a heat pack or a warm water soak. SEEK MEDICAL CARE IF:   Symptoms get worse or do not improve in 4   to 6 weeks, despite treatment.  New, unexplained symptoms develop. (Drugs used in treatment may produce side effects.) EXERCISES RANGE OF MOTION (ROM) AND STRETCHING EXERCISES - Medial Tibial Stress Syndrome (Shin  Splints) These exercises may help you when beginning to rehabilitate your injury. Your symptoms may resolve with or without further involvement from your physician, physical therapist or athletic trainer. While completing these exercises, remember:   Restoring tissue flexibility helps normal motion to return to the joints. This allows healthier, less painful movement and activity.  An effective stretch should be held for at least 30 seconds.  A stretch should never be painful. You should only feel a gentle lengthening or release in the stretched tissue. STRETCH - Gastroc, Standing  Place your hands on a wall.  Extend your right / left leg behind you, keeping the front knee somewhat bent.  Slightly point your toes inward on your back foot.  Keeping your right / left heel on the floor and your knee straight, shift your weight toward the wall, not allowing your back to arch.  You should feel a gentle stretch in the right / left calf. Hold this position for __________ seconds. Repeat __________ times. Complete this stretch __________ times per day. STRETCH - Soleus, Standing   Place your hands on a wall.  Extend your right / left leg behind you, keeping the other knee somewhat bent.  Slightly point your toes inward on your back foot.  Keep your right / left heel on the floor, bend your back knee, and slightly shift your weight over the back leg so that you feel a gentle stretch deep in your back calf.  Hold this position for __________ seconds. Repeat __________ times. Complete this stretch __________ times per day. STRETCH - Gastrocsoleus, Standing  Note: This exercise can place a lot of stress on your foot and ankle. Please complete this exercise only if specifically instructed by your caregiver.   Place the ball of your right / left foot on a step, keeping your other foot firmly on the same step.  Hold on to the wall or a rail for balance.  Slowly lift your other foot, allowing  your body weight to press your heel down over the edge of the step.  You should feel a stretch in your right / left calf.  Hold this position for __________ seconds.  Repeat this exercise with a slight bend in your right / left knee. Repeat __________ times. Complete this stretch __________ times per day.  RANGE OF MOTION - Ankle Eversion   Sit with your right / left ankle crossed over your opposite knee.  Grip your foot with your opposite hand, placing your thumb on the top of your foot and your fingers across the bottom of your foot.  Gently push your foot downward with a slight rotation so your littlest toes rise slightly toward the ceiling.  You should feel a gentle stretch on the inside of your ankle. Hold the stretch for __________ seconds. Repeat __________ times. Complete this exercise __________ times per day.  RANGE OF MOTION - Ankle Inversion  Sit with your right / left ankle crossed over your opposite knee.  Grip your foot with your opposite hand, placing your thumb on the bottom of your foot and your fingers across the top of your foot.  Gently pull your foot so the smallest toe comes toward you and your thumb pushes the inside of the ball of your foot away from you.  You should   feel a gentle stretch on the outside of your ankle. Hold the stretch for __________ seconds. Repeat __________ times. Complete this exercise __________ times per day.  RANGE OF MOTION- Ankle Plantar Flexion   Sit with your right / left leg crossed over your opposite knee.  Use your opposite hand to pull the top of your foot and toes toward you.  You should feel a gentle stretch on the top of your foot and ankle. Hold this position for __________ seconds. Repeat __________ times. Complete __________ times per day.  STRENGTHENING EXERCISES - Medial Tibial Stress Syndrome (Shin Splints) These exercises may help you when beginning to rehabilitate your injury. They may resolve your symptoms with  or without further involvement from your physician, physical therapist or athletic trainer. While completing these exercises, remember:   Muscles can gain both the endurance and the strength needed for everyday activities through controlled exercises.  Complete these exercises as instructed by your physician, physical therapist or athletic trainer. Increase the resistance and repetitions only as guided by your caregiver. STRENGTH - Dorsiflexors  Secure a rubber exercise band or tubing to a fixed object (table, pole) and loop the other end around your right / left foot.  Sit on the floor facing the fixed object. The band should be slightly tense when your foot is relaxed.  Slowly draw your foot back toward you, using your ankle and toes.  Hold this position for __________ seconds. Slowly release the tension in the band, return your foot to the starting position. Repeat __________ times. Complete this exercise __________ times per day.  STRENGTH - Towel Curls  Sit in a chair, on a non-carpeted surface.  Place your foot on a towel, keeping your heel on the floor.  Pull the towel toward your heel only by curling your toes. Keep your heel on the floor.  If instructed by your physician, physical therapist or athletic trainer, you may add weight at the end of the towel. Repeat __________ times. Complete this exercise __________ times per day. STRENGTH - Ankle Inversion  Secure one end of a rubber exercise band or tubing to a fixed object (table, pole). Loop the other end around your foot, just before your toes.  Place your fists between your knees. This will focus your strengthening at your ankle.  Slowly, pull your big toe up and in, making sure the band is positioned to resist the entire motion.  Hold this position for __________ seconds.  Have your muscles resist the band, as it slowly pulls your foot back to the starting position. Repeat __________ times. Complete this exercises  __________ times per day.  Document Released: 08/10/2005 Document Revised: 11/02/2011 Document Reviewed: 11/22/2008 [employer] Patient Information 2015 [employer]. This information is not intended to replace advice given to you by your health care provider. Make sure you discuss any questions you have with your health care provider.  

## 2014-06-26 NOTE — Progress Notes (Signed)
Subjective:    Patient ID: Christina [name], female    DOB: [birth year], 38 y.o.   MRN: [id number]  HPI  Pt presents to the clinic today with c/o pain in her right lower leg. She reports that she originally had an injury to her right lower leg 11/2013. Her daughter had kicked her in the leg during [place]ae won doe. There was some redness and swelling in the area initially. She also had pain with walking. She saw her PCP at the time who did a RLE doppler which was negative for DVT. She reports the pain eventaully went away but has returned over the last few weeks. She has noticed an increase in swelling as well as a noticeable "dent" at the site of the previous injury. She has pain with weight bearing. She denies any new injury to the area but does report she has been doing tae won doe, round house kicks and a lot of jumping recently.  Review of Systems  Past Medical History  Diagnosis Date  . Family planning   . Allergy     Current Outpatient Prescriptions  Medication Sig Dispense Refill  . cetirizine (ZYRTEC) 10 MG tablet Take 10 mg by mouth daily.    . diphenhydrAMINE (BENADRYL) 25 MG tablet Take 25 mg by mouth at bedtime as needed.    . Norethindrone Acet-Ethinyl Est (LOESTRIN 1.5/30, 21,) 1.5-30 MG-MCG TABS Take 1 tablet by mouth daily.       No current facility-administered medications for this visit.    Allergies  Allergen Reactions  . Azithromycin     REACTION: Lewinski  . Penicillins     Fromer  . Prednisone     Goetze and short of breath    Family History  Problem Relation Age of Onset  . Hypertension Father     History   Social History  . Marital Status: Married    Spouse Name: N/A    Number of Children: 1  . Years of Education: N/A   Occupational History  . Working for [employer] firm    Social History Main Topics  . Smoking status: Never Smoker   . Smokeless tobacco: Never Used  . Alcohol Use: Yes     Comment: rarely  . Drug Use: No  . Sexual Activity: Not on file    Other Topics Concern  . Not on file   Social History Narrative     Constitutional: Denies fever, malaise, fatigue, headache or abrupt weight changes.  Respiratory: Denies difficulty breathing, shortness of breath, cough or sputum production.   Cardiovascular: Denies chest pain, chest tightness, palpitations or swelling in the hands or feet.  Musculoskeletal: Pt reports right lower leg pain. Denies decrease in range of motion, difficulty with gait, muscle pain or joint pain and swelling.  Skin: Denies redness, rashes, lesions or ulcercations.   No other specific complaints in a complete review of systems (except as listed in HPI above).     Objective:   Physical Exam   BP 122/84 mmHg  Pulse 76  Temp(Src) 97.9 F (36.6 C) (Oral)  Wt 196 lb (88.905 kg)  SpO2 98% Wt Readings from Last 3 Encounters:  06/26/14 196 lb (88.905 kg)  12/18/13 197 lb 12 oz (89.699 kg)  07/07/13 199 lb (90.266 kg)    General: Appears her stated age, obese but well developed, well nourished in NAD. Skin: Warm, dry and intact. No rashes, lesions or ulcerations noted. Cardiovascular: Normal rate and rhythm. S1,S2 noted.  No  murmur, rubs or gallops noted.  Pulmonary/Chest: Normal effort and positive vesicular breath sounds. No respiratory distress. No wheezes, rales or ronchi noted.  Musculoskeletal: Normal flexion and extension of the right knee and ankle. No pain with palpation of the right knee. Pain with palpation over the right tibia- but no deformity noted.. No difficulty with gait.   BMET    Component Value Date/Time   NA 134* 03/19/2011 0825   K 3.8 03/19/2011 0825   CL 100 03/19/2011 0825   CO2 27 03/19/2011 0825   GLUCOSE 93 03/19/2011 0825   BUN 8 03/19/2011 0825   CREATININE 0.8 03/19/2011 0825   CALCIUM 8.6 03/19/2011 0825    Lipid Panel  No results found for: CHOL, TRIG, HDL, CHOLHDL, VLDL, LDLCALC  CBC    Component Value Date/Time   WBC 21.9 cH* 03/19/2011 0825   RBC 4.37  03/19/2011 0825   HGB 13.2 03/19/2011 0825   HCT 38.9 03/19/2011 0825   PLT 358.0 03/19/2011 0825   MCV 89.0 03/19/2011 0825   MCHC 34.0 03/19/2011 0825   RDW 13.1 03/19/2011 0825   LYMPHSABS 1.3 03/19/2011 0825   MONOABS 1.4* 03/19/2011 0825   EOSABS 0.6 03/19/2011 0825   BASOSABS 0.0 03/19/2011 0825    Hgb A1C No results found for: HGBA1C      Assessment & Plan:   Pain in right lower leg:  ? Shin splints Will obtain xray of tibia to r/o old fracture from previous injury I would advise rest from extreme weight bearing activity NSAIDS and ice would be helpful  Will let you know results of xray- may needs sports med referral

## 2014-06-26 NOTE — Progress Notes (Signed)
Pre visit review using our clinic review tool, if applicable. No additional management support is needed unless otherwise documented below in the visit note. 

## 2014-08-01 ENCOUNTER — Encounter: Payer: Self-pay | Admitting: Internal Medicine

## 2014-08-01 ENCOUNTER — Ambulatory Visit (INDEPENDENT_AMBULATORY_CARE_PROVIDER_SITE_OTHER): Payer: BC Managed Care – PPO | Admitting: Internal Medicine

## 2014-08-01 VITALS — BP 120/84 | HR 76 | Temp 98.3°F | Wt 199.0 lb

## 2014-08-01 DIAGNOSIS — J0100 Acute maxillary sinusitis, unspecified: Secondary | ICD-10-CM

## 2014-08-01 MED ORDER — DOXYCYCLINE HYCLATE 100 MG PO TABS: 100.0000 mg | ORAL_TABLET | Freq: Two times a day (BID) | ORAL | Status: DC

## 2014-08-01 NOTE — Progress Notes (Signed)
HPI  Pt presents to the clinic today with c/o headache, facial pain and pressure, ear fullness, nasal congestion sore throat and cough. She reports this started 3-4 weeks ago. She is blowing yellow mucous from her nose. The cough is unproductive. She denies fever, chills or body aches. She has tried Tylenol cold and sinus without relief. She has had sick contacts. She does have seasonal allergies, she takes zyrtec daily. She does not smoke.   Review of Systems    Past Medical History  Diagnosis Date  . Family planning   . Allergy     Family History  Problem Relation Age of Onset  . Hypertension Father     History   Social History  . Marital Status: Married    Spouse Name: N/A    Number of Children: 1  . Years of Education: N/A   Occupational History  . Working for [employer] firm    Social History Main Topics  . Smoking status: Never Smoker   . Smokeless tobacco: Never Used  . Alcohol Use: Yes     Comment: rarely  . Drug Use: No  . Sexual Activity: Not on file   Other Topics Concern  . Not on file   Social History Narrative    Allergies  Allergen Reactions  . Azithromycin     REACTION: Lafont  . Penicillins     Stratmann  . Prednisone     [place] and short of breath     Constitutional: Positive headache, fatigue. Denies fever or  abrupt weight changes.  HEENT:  Positive  facial pain, nasal congestion and sore throat. Denies eye redness, ear pain, ringing in the ears, wax buildup, runny nose or bloody nose. Respiratory: Positive cough. Denies difficulty breathing or shortness of breath.  Cardiovascular: Denies chest pain, chest tightness, palpitations or swelling in the hands or feet.   No other specific complaints in a complete review of systems (except as listed in HPI above).  Objective:   BP 120/84 mmHg  Pulse 76  Temp(Src) 98.3 F (36.8 C) (Oral)  Wt 199 lb (90.266 kg)  SpO2 98%  General: Appears her stated age, well developed, well nourished in NAD. HEENT:  Head: normal shape and size, maxillary sinus tenderness noted;Ears: Tm's pink but intact, normal light reflex; Nose: mucosa pink and moist, septum midline; Throat/Mouth: + PND. Teeth present, mucosa pink and moist, no exudate noted, no lesions or ulcerations noted.  No adenopathy noted. Cardiovascular: Normal rate and rhythm. S1,S2 noted.  No murmur, rubs or gallops noted.  Pulmonary/Chest: Normal effort and positive vesicular breath sounds. No respiratory distress. No wheezes, rales or ronchi noted.      Assessment & Plan:   Acute maxillary sinusitis  Can use a Neti Pot which can be purchased from your local drug store. Flonase 2 sprays each nostril for 3 days and then as needed. Continue zyrtec Doxycycline BID for 10 days  RTC as needed or if symptoms persist.

## 2014-08-01 NOTE — Progress Notes (Signed)
Pre visit review using our clinic review tool, if applicable. No additional management support is needed unless otherwise documented below in the visit note. 

## 2014-08-01 NOTE — Patient Instructions (Signed)

## 2014-09-07 ENCOUNTER — Ambulatory Visit (INDEPENDENT_AMBULATORY_CARE_PROVIDER_SITE_OTHER): Payer: BLUE CROSS/BLUE SHIELD | Admitting: Family Medicine

## 2014-09-07 ENCOUNTER — Encounter: Payer: Self-pay | Admitting: Family Medicine

## 2014-09-07 VITALS — BP 126/82 | HR 68 | Temp 98.2°F | Wt 199.5 lb

## 2014-09-07 DIAGNOSIS — J019 Acute sinusitis, unspecified: Secondary | ICD-10-CM | POA: Insufficient documentation

## 2014-09-07 DIAGNOSIS — J0100 Acute maxillary sinusitis, unspecified: Secondary | ICD-10-CM

## 2014-09-07 NOTE — Progress Notes (Signed)
   BP 126/82 mmHg  Pulse 68  Temp(Src) 98.2 F (36.8 C) (Oral)  Wt 199 lb 8 oz (90.493 kg)  LMP 03/19/2014   CC: I think I have a sinus infection  Subjective:    Patient ID: Christina [name], female    DOB: 02-Aug-1976, 39 y.o.   MRN: [id number]  HPI: Christina [name] is a 39 y.o. female presenting on 09/07/2014 for Sinusitis   5d h/o facial pain and pressure as well as colored mucous. + tooth pain.  No fevers/chills, cough, ear pain, PNdrainage, ST.  Recurrent sinus infections, last 07/2014.  No sick contacts for this. Has tried tylenol cold/sinus. No smokers at home. No h/o asthma  Relevant past medical, surgical, family and social history reviewed and updated as indicated. Interim medical history since our last visit reviewed. Allergies and medications reviewed and updated. Current Outpatient Prescriptions on File Prior to Visit  Medication Sig  . cetirizine (ZYRTEC) 10 MG tablet Take 10 mg by mouth daily.  . diphenhydrAMINE (BENADRYL) 25 MG tablet Take 25 mg by mouth at bedtime as needed.  . Norethindrone Acet-Ethinyl Est (LOESTRIN 1.5/30, 21,) 1.5-30 MG-MCG TABS Take 1 tablet by mouth daily.     No current facility-administered medications on file prior to visit.    Review of Systems Per HPI unless specifically indicated above     Objective:    BP 126/82 mmHg  Pulse 68  Temp(Src) 98.2 F (36.8 C) (Oral)  Wt 199 lb 8 oz (90.493 kg)  LMP 03/19/2014  Wt Readings from Last 3 Encounters:  09/07/14 199 lb 8 oz (90.493 kg)  08/01/14 199 lb (90.266 kg)  06/26/14 196 lb (88.905 kg)    Physical Exam  Constitutional: She appears well-developed and well-nourished. No distress.  HENT:  Head: Normocephalic and atraumatic.  Right Ear: Hearing, tympanic membrane, external ear and ear canal normal.  Left Ear: Hearing, tympanic membrane, external ear and ear canal normal.  Nose: Mucosal edema and rhinorrhea present. Right sinus exhibits maxillary sinus tenderness. Right  sinus exhibits no frontal sinus tenderness. Left sinus exhibits maxillary sinus tenderness. Left sinus exhibits no frontal sinus tenderness.  Mouth/Throat: Uvula is midline and mucous membranes are normal. Posterior oropharyngeal erythema (raw) present. No oropharyngeal exudate, posterior oropharyngeal edema or tonsillar abscesses.  Eyes: Conjunctivae and EOM are normal. Pupils are equal, round, and reactive to light. No scleral icterus.  Neck: Normal range of motion. Neck supple.  Cardiovascular: Normal rate, regular rhythm, normal heart sounds and intact distal pulses.   No murmur heard. Pulmonary/Chest: Effort normal and breath sounds normal. No respiratory distress. She has no wheezes. She has no rales.  Lymphadenopathy:    She has no cervical adenopathy.  Skin: Skin is warm and dry. No Delfin noted.  Nursing note and vitals reviewed.     Assessment & Plan:   Problem List Items Addressed This Visit    Acute sinusitis - Primary    Anticipate viral given short duration. Supportive care as per instructions. Discussed ibuprofen and guaifenesin use. Update if not improving as expected.      Relevant Medications   pseudoephedrine-acetaminophen (TYLENOL SINUS) 30-500 MG TABS       Follow up plan: Return if symptoms worsen or fail to improve.

## 2014-09-07 NOTE — Assessment & Plan Note (Signed)
Anticipate viral given short duration. Supportive care as per instructions. Discussed ibuprofen and guaifenesin use. Update if not improving as expected.

## 2014-09-07 NOTE — Progress Notes (Signed)
Pre visit review using our clinic review tool, if applicable. No additional management support is needed unless otherwise documented below in the visit note. 

## 2014-09-07 NOTE — Patient Instructions (Signed)
You have a sinus infection, likely viral. Push fluids and plenty of rest. Plain mucinex or immediate release guaifenesin with plenty of water to help mobilize mucous. advil 400-600mg  twice daily with food for sinus inflammation. Nasal saline irrigation or neti pot to help drain sinuses. May use simple mucinex with plenty of fluid to help mobilize mucous. Let us know if fever >101.5, persistent symptoms past 10 days, or worsening

## 2014-09-12 ENCOUNTER — Telehealth: Payer: Self-pay

## 2014-09-12 MED ORDER — DOXYCYCLINE HYCLATE 100 MG PO CAPS: 100.0000 mg | ORAL_CAPSULE | Freq: Two times a day (BID) | ORAL | Status: DC

## 2014-09-12 NOTE — Telephone Encounter (Signed)
Pt seen on 09/07/14; pt has no energy, pt has constant throbbing under her eyes and teeth hurt; no fever,S/T or cough. Pt request abx to help symptoms. [facility].Please advise.

## 2014-09-12 NOTE — Telephone Encounter (Signed)
Message left notifying patient.

## 2014-09-12 NOTE — Telephone Encounter (Signed)
plz notify doxycycline abx called into pharmacy, take with food. Caution with increased sensitivity to sun while on abx, use 2nd form birth control while on abx. Noted PCN and azithromycin allergy

## 2015-07-03 ENCOUNTER — Ambulatory Visit: Payer: BLUE CROSS/BLUE SHIELD | Admitting: Internal Medicine

## 2015-07-03 ENCOUNTER — Ambulatory Visit (INDEPENDENT_AMBULATORY_CARE_PROVIDER_SITE_OTHER): Payer: BLUE CROSS/BLUE SHIELD | Admitting: Family Medicine

## 2015-07-03 ENCOUNTER — Ambulatory Visit: Payer: BLUE CROSS/BLUE SHIELD | Admitting: Family Medicine

## 2015-07-03 ENCOUNTER — Encounter: Payer: Self-pay | Admitting: Family Medicine

## 2015-07-03 VITALS — BP 128/94 | HR 73 | Temp 98.0°F | Wt 199.5 lb

## 2015-07-03 DIAGNOSIS — J0100 Acute maxillary sinusitis, unspecified: Secondary | ICD-10-CM

## 2015-07-03 MED ORDER — DOXYCYCLINE HYCLATE 100 MG PO CAPS: 100.0000 mg | ORAL_CAPSULE | Freq: Two times a day (BID) | ORAL | Status: DC

## 2015-07-03 NOTE — Progress Notes (Signed)
   BP 128/94 mmHg  Pulse 73  Temp(Src) 98 F (36.7 C) (Oral)  Wt 199 lb 8 oz (90.493 kg)  SpO2 98%   CC: I think I have a sinus infection  Subjective:    Patient ID: Christina [name], female    DOB: Aug 21, 1976, 39 y.o.   MRN: [id number]  HPI: Christina [name] is a 39 y.o. female presenting on 07/03/2015 for Nasal Congestion and Facial Pain   2 week h/o facial pain and pressure as well as colored mucous. + tooth pain. +sore throat past few days.  No fevers/chills, ear pain, or cough  Recurrent sinus infections, last 07/2014.  No sick contacts for this. Has tried tylenol cold/sinus. No smokers at home. No h/o asthma  Relevant past medical, surgical, family and social history reviewed and updated as indicated. Interim medical history since our last visit reviewed. Allergies and medications reviewed and updated. Current Outpatient Prescriptions on File Prior to Visit  Medication Sig  . cetirizine (ZYRTEC) 10 MG tablet Take 10 mg by mouth daily.  . Norethindrone Acet-Ethinyl Est (LOESTRIN 1.5/30, 21,) 1.5-30 MG-MCG TABS Take 1 tablet by mouth daily.    . pseudoephedrine-acetaminophen (TYLENOL SINUS) 30-500 MG TABS Take 1 tablet by mouth every 4 (four) hours as needed.   No current facility-administered medications on file prior to visit.    Review of Systems Per HPI unless specifically indicated above     Objective:    BP 128/94 mmHg  Pulse 73  Temp(Src) 98 F (36.7 C) (Oral)  Wt 199 lb 8 oz (90.493 kg)  SpO2 98%  Wt Readings from Last 3 Encounters:  07/03/15 199 lb 8 oz (90.493 kg)  09/07/14 199 lb 8 oz (90.493 kg)  08/01/14 199 lb (90.266 kg)    Physical Exam  Constitutional: She appears well-developed and well-nourished. No distress.  HENT:  Head: Normocephalic and atraumatic.  Right Ear: Hearing, tympanic membrane, external ear and ear canal normal.  Left Ear: Hearing, tympanic membrane, external ear and ear canal normal.  Nose: Mucosal edema and rhinorrhea  present. Right sinus exhibits maxillary sinus tenderness. Right sinus exhibits no frontal sinus tenderness. Left sinus exhibits maxillary sinus tenderness. Left sinus exhibits no frontal sinus tenderness.  Mouth/Throat: Uvula is midline and mucous membranes are normal. Posterior oropharyngeal erythema (raw) present. No oropharyngeal exudate, posterior oropharyngeal edema or tonsillar abscesses.  Eyes: Conjunctivae and EOM are normal. Pupils are equal, round, and reactive to light. No scleral icterus.  Neck: Normal range of motion. Neck supple.  Cardiovascular: Normal rate, regular rhythm, normal heart sounds and intact distal pulses.   No murmur heard. Pulmonary/Chest: Effort normal and breath sounds normal. No respiratory distress. She has no wheezes. She has no rales.  Lymphadenopathy:    She has no cervical adenopathy.  Skin: Skin is warm and dry. No [place] noted.  Nursing note and vitals reviewed.     Assessment & Plan:   Problem List Items Addressed This Visit    Acute sinusitis - Primary       Follow up plan: No Follow-up on file.

## 2015-07-03 NOTE — Progress Notes (Signed)
Pre visit review using our clinic review tool, if applicable. No additional management support is needed unless otherwise documented below in the visit note. 

## 2015-07-03 NOTE — Assessment & Plan Note (Signed)
New-  Given duration and progression of symptoms, will treat for bacterial sinusitis with doxycyline. PMH significant for PCN and azithromycin allergy.

## 2015-07-10 ENCOUNTER — Telehealth: Payer: Self-pay | Admitting: Family Medicine

## 2015-07-10 NOTE — Telephone Encounter (Signed)
Please call pt- what else is she taking for symptoms?

## 2015-07-10 NOTE — Telephone Encounter (Signed)
Spoke to pt who states she has been taking sudafed OTC

## 2015-07-10 NOTE — Telephone Encounter (Signed)
Spoke to pt and advised per Dr Deborra Medina. Pt will contact office back with update

## 2015-07-10 NOTE — Telephone Encounter (Signed)
Pt stated she came in Wednesday with sinus infection  Still not any better she has been taking her meds She wanted to know [facility] Please advise

## 2015-07-10 NOTE — Telephone Encounter (Signed)
Give it a little more time.  I know it's frustrating.  Any fevers?  Please have her update [place] tomorrow.

## 2015-10-14 ENCOUNTER — Encounter: Payer: Self-pay | Admitting: Family Medicine

## 2015-10-14 ENCOUNTER — Ambulatory Visit (INDEPENDENT_AMBULATORY_CARE_PROVIDER_SITE_OTHER): Payer: BLUE CROSS/BLUE SHIELD | Admitting: Family Medicine

## 2015-10-14 VITALS — BP 124/86 | HR 99 | Temp 98.6°F | Wt 195.5 lb

## 2015-10-14 DIAGNOSIS — J069 Acute upper respiratory infection, unspecified: Secondary | ICD-10-CM | POA: Diagnosis not present

## 2015-10-14 NOTE — Progress Notes (Signed)
Pre visit review using our clinic review tool, if applicable. No additional management support is needed unless otherwise documented below in the visit note. 

## 2015-10-14 NOTE — Progress Notes (Signed)
SUBJECTIVE:  Christina [name] is a 40 y.o. female who complains of coryza, congestion, sneezing, sore throat and dry cough for 5 days. She denies a history of anorexia and chest pain and denies a history of asthma. Patient denies smoke cigarettes.   Allergic to azithromycin and PCNs.  Current Outpatient Prescriptions on File Prior to Visit  Medication Sig Dispense Refill  . cetirizine (ZYRTEC) 10 MG tablet Take 10 mg by mouth daily.    . Norethindrone Acet-Ethinyl Est (LOESTRIN 1.5/30, 21,) 1.5-30 MG-MCG TABS Take 1 tablet by mouth daily.      . pseudoephedrine-acetaminophen (TYLENOL SINUS) 30-500 MG TABS Take 1 tablet by mouth every 4 (four) hours as needed.     No current facility-administered medications on file prior to visit.    Allergies  Allergen Reactions  . Azithromycin     REACTION: [place]  . Penicillins     [place]  . Prednisone     [place] and short of breath    Past Medical History  Diagnosis Date  . Family planning   . Allergy     Past Surgical History  Procedure Laterality Date  . Cesarean section  01/31/2006    Family History  Problem Relation Age of Onset  . Hypertension Father     Social History   Social History  . Marital Status: Married    Spouse Name: N/A  . Number of Children: 1  . Years of Education: N/A   Occupational History  . Working for [employer] firm    Social History Main Topics  . Smoking status: Never Smoker   . Smokeless tobacco: Never Used  . Alcohol Use: 0.0 oz/week    0 Standard drinks or equivalent per week     Comment: rarely  . Drug Use: No  . Sexual Activity: Not on file   Other Topics Concern  . Not on file   Social History Narrative   The PMH, PSH, Social History, Family History, Medications, and allergies have been reviewed in [facility], and have been updated if relevant.   OBJECTIVE: BP 124/86 mmHg  Pulse 99  Temp(Src) 98.6 F (37 C) (Oral)  Wt 195 lb 8 oz (88.678 kg)  SpO2 96%  She appears well, vital signs are as  noted. Ears normal.  Throat and pharynx normal.  Neck supple. No adenopathy in the neck. Nose is congested. Sinuses non tender. The chest is clear, without wheezes or rales.  ASSESSMENT:  viral upper respiratory illness  PLAN: Symptomatic therapy suggested: push fluids, rest and return office visit prn if symptoms persist or worsen. Lack of antibiotic effectiveness discussed with her. Call or return to clinic prn if these symptoms worsen or fail to improve as anticipated.

## 2015-10-21 ENCOUNTER — Telehealth: Payer: Self-pay

## 2015-10-21 NOTE — Telephone Encounter (Signed)
Pt left v/m; pt was seen 10/14/15; pt is not worse but pt does not feel better; pt has low grade fever daily; congestion has moved into chest and pt is coughing; pt wants to know if needs to be rechecked or could pt get abx. [facility] [place]

## 2015-10-21 NOTE — Telephone Encounter (Signed)
Given her multiple abx allergies, I would prefer for her to be re evaluated first.

## 2015-10-21 NOTE — Telephone Encounter (Signed)
Lm on pts vm and advised per Dr Deborra Medina. Pt advised to contact office should she want to schedule an appt

## 2015-10-22 ENCOUNTER — Encounter: Payer: Self-pay | Admitting: Family Medicine

## 2015-10-22 ENCOUNTER — Ambulatory Visit (INDEPENDENT_AMBULATORY_CARE_PROVIDER_SITE_OTHER): Payer: BLUE CROSS/BLUE SHIELD | Admitting: Family Medicine

## 2015-10-22 VITALS — BP 120/74 | HR 91 | Temp 97.9°F | Wt 197.8 lb

## 2015-10-22 DIAGNOSIS — J069 Acute upper respiratory infection, unspecified: Secondary | ICD-10-CM | POA: Diagnosis not present

## 2015-10-22 MED ORDER — BENZONATATE 200 MG PO CAPS: 200.0000 mg | ORAL_CAPSULE | Freq: Two times a day (BID) | ORAL | Status: DC | PRN

## 2015-10-22 MED ORDER — DOXYCYCLINE HYCLATE 100 MG PO TABS: 100.0000 mg | ORAL_TABLET | Freq: Two times a day (BID) | ORAL | Status: DC

## 2015-10-22 NOTE — Assessment & Plan Note (Signed)
Deteriorated. Given duration and progression of symptoms, will treat for bacterial sinusitis/bronchitis with doxycyline- multiple abx allergies. eRx sent for tessalon perles as well to use prn cough. Ok to continue mucinex but advised to push fluids. Call or return to clinic prn if these symptoms worsen or fail to improve as anticipated. The patient indicates understanding of these issues and agrees with the plan.

## 2015-10-22 NOTE — Progress Notes (Signed)
Pre visit review using our clinic review tool, if applicable. No additional management support is needed unless otherwise documented below in the visit note. 

## 2015-10-22 NOTE — Patient Instructions (Signed)
Great to see you. Take Doxycyline as directed- 1 tab by mouth twice daily x 10 days.

## 2015-10-22 NOTE — Progress Notes (Signed)
Subjective:   Patient ID: Christina [name], female    DOB: 07/15/1976, 39 y.o.   MRN: [id number]  Christina [name] is a pleasant 40 y.o. year old female who presents to clinic today with Follow-up  on 10/22/2015  HPI: I saw her for URI symptoms 1 week ago.  Note reviewed from 10/14/15.  At that time she complained of 5 days of congestion, sneezing, sore throat.  Exam reassuring- advised likely viral and suggested supportive care. Also advised to follow up if symptoms were not improving as suspected.  She called yesterday stating that she was not feeling worse but not feeling better.  Has a low grade fever daily and cough is now productive.  I advised her to come in to get re evaluated since she is allergic to multiple different antibiotics.  Mucinex and OTC cough suppressants are not helping much.  Tmax 100  Current Outpatient Prescriptions on File Prior to Visit  Medication Sig Dispense Refill  . cetirizine (ZYRTEC) 10 MG tablet Take 10 mg by mouth daily.    . Norethindrone Acet-Ethinyl Est (LOESTRIN 1.5/30, 21,) 1.5-30 MG-MCG TABS Take 1 tablet by mouth daily.      . pseudoephedrine-acetaminophen (TYLENOL SINUS) 30-500 MG TABS Take 1 tablet by mouth every 4 (four) hours as needed.     No current facility-administered medications on file prior to visit.    Allergies  Allergen Reactions  . Azithromycin     REACTION: [place]  . Penicillins     [place]  . Prednisone     [place]f and short of breath    Past Medical History  Diagnosis Date  . Family planning   . Allergy     Past Surgical History  Procedure Laterality Date  . Cesarean section  01/31/2006    Family History  Problem Relation Age of Onset  . Hypertension Father     Social History   Social History  . Marital Status: Married    Spouse Name: N/A  . Number of Children: 1  . Years of Education: N/A   Occupational History  . Working for [employer] firm    Social History Main Topics  . Smoking status: Never Smoker     . Smokeless tobacco: Never Used  . Alcohol Use: 0.0 oz/week    0 Standard drinks or equivalent per week     Comment: rarely  . Drug Use: No  . Sexual Activity: Not on file   Other Topics Concern  . Not on file   Social History Narrative   The PMH, PSH, Social History, Family History, Medications, and allergies have been reviewed in [facility], and have been updated if relevant.   Review of Systems  Constitutional: Positive for fever and fatigue.  HENT: Positive for congestion, postnasal drip and sinus pressure.   Respiratory: Positive for cough. Negative for shortness of breath.   Cardiovascular: Negative.   Gastrointestinal: Negative.   Endocrine: Negative.   Genitourinary: Negative.   Musculoskeletal: Negative.   Skin: Negative.   Allergic/Immunologic: Negative.   Neurological: Negative.  Negative for dizziness.  Hematological: Negative.   Psychiatric/Behavioral: Negative.   All other systems reviewed and are negative.      Objective:    BP 120/74 mmHg  Pulse 91  Temp(Src) 97.9 F (36.6 C) (Oral)  Wt 197 lb 12 oz (89.699 kg)  SpO2 98%   Physical Exam  Constitutional: She is oriented to person, place, and time. She appears well-developed. No distress.  HENT:  Head: Normocephalic  and atraumatic.  Right Ear: Hearing and tympanic membrane normal.  Left Ear: Hearing and tympanic membrane normal.  Nose: Rhinorrhea present. Right sinus exhibits maxillary sinus tenderness and frontal sinus tenderness. Left sinus exhibits frontal sinus tenderness. Left sinus exhibits no maxillary sinus tenderness.  Neck: Neck supple.  Cardiovascular: Normal rate.   Pulmonary/Chest: Effort normal and breath sounds normal. No respiratory distress.  Neurological: She is alert and oriented to person, place, and time.  Skin: Skin is warm and dry. She is not diaphoretic.  Psychiatric: She has a normal mood and affect. Her behavior is normal. Judgment and thought content normal.           Assessment & Plan:   Acute upper respiratory infection No Follow-up on file.

## 2015-10-23 ENCOUNTER — Ambulatory Visit: Payer: BLUE CROSS/BLUE SHIELD | Admitting: Family Medicine

## 2015-11-04 ENCOUNTER — Ambulatory Visit: Payer: BLUE CROSS/BLUE SHIELD | Admitting: Family Medicine

## 2015-11-06 ENCOUNTER — Encounter: Payer: Self-pay | Admitting: Primary Care

## 2015-11-06 ENCOUNTER — Ambulatory Visit: Payer: BLUE CROSS/BLUE SHIELD | Admitting: Family Medicine

## 2015-11-06 ENCOUNTER — Ambulatory Visit (INDEPENDENT_AMBULATORY_CARE_PROVIDER_SITE_OTHER): Payer: BLUE CROSS/BLUE SHIELD | Admitting: Primary Care

## 2015-11-06 VITALS — BP 124/84 | HR 79 | Temp 97.9°F | Wt 200.8 lb

## 2015-11-06 DIAGNOSIS — R05 Cough: Secondary | ICD-10-CM

## 2015-11-06 DIAGNOSIS — J069 Acute upper respiratory infection, unspecified: Secondary | ICD-10-CM | POA: Diagnosis not present

## 2015-11-06 DIAGNOSIS — R059 Cough, unspecified: Secondary | ICD-10-CM

## 2015-11-06 MED ORDER — ALBUTEROL SULFATE HFA 108 (90 BASE) MCG/ACT IN AERS: 2.0000 | INHALATION_SPRAY | Freq: Four times a day (QID) | RESPIRATORY_TRACT | Status: DC | PRN

## 2015-11-06 MED ORDER — HYDROCOD POLST-CPM POLST ER 10-8 MG/5ML PO SUER: 5.0000 mL | Freq: Two times a day (BID) | ORAL | Status: DC | PRN

## 2015-11-06 NOTE — Patient Instructions (Signed)
You may take the Tussionex cough suppressant at twice daily as needed for cough and rest. Caution this medication contains codeine and will make you feel drowsy.  If this medication makes you too drowsy, then try Delsym or Robitussin.  Use the albuterol inhaler every 6 hours as needed for bronchospasm/cough.  Increase consumption of fluids and rest.  It was a pleasure meeting you!

## 2015-11-06 NOTE — Assessment & Plan Note (Signed)
Overall improved except for persistent cough. Exam with mild rhonchi to upper lobes, otherwise unremarkable. Dry cough noted on exam, sounds like bronchospasm. Discussed post bronchitic cough that may last up to 2 weeks post treatment.  Will treat with supportive measures as Doxy course was sufficient to treat the infection. RX for Tussionex BID PRN and albuterol inhaler q 6 PRN bronchospasm. Rest and fluids. Follow up PRN.

## 2015-11-06 NOTE — Progress Notes (Signed)
Pre visit review using our clinic review tool, if applicable. No additional management support is needed unless otherwise documented below in the visit note. 

## 2015-11-06 NOTE — Progress Notes (Signed)
Subjective:    Patient ID: Christina [name], female    DOB: [birth year], 40 y.o.   MRN: [id number]  HPI  Christina [name] is a 40 year old female who presents today with a chief complaint of cough. She also reports sore throat and nasal congestion. Her symptoms have been present for 3+ weeks. She was initially evaluated on 10/14/15 and diagnosed with a viral URI and treated with supportive measures. She returned on 10/22/15 as directed as her cough became productive and her symptoms deteriorated. She was provided with a Doxycycline course and tessalon pearls.  She presents today as she continues to experience a cough. She completed her 10 day doxycycline course last Thursday. Overall she's feeling better. Denies fevers, sinus pressure, minor sore throat, shortness of breath, wheezing. She is currently taking Mucinex without improvement. Nothing makes her cough better or worse.   Review of Systems  Constitutional: Negative for fever, chills and fatigue.  HENT: Positive for sore throat. Negative for congestion and sinus pressure.   Respiratory: Positive for cough. Negative for shortness of breath.   Cardiovascular: Negative for chest pain.  Neurological: Negative for weakness and headaches.       Past Medical History  Diagnosis Date  . Family planning   . Allergy     Social History   Social History  . Marital Status: Married    Spouse Name: N/A  . Number of Children: 1  . Years of Education: N/A   Occupational History  . Working for [employer]    Social History Main Topics  . Smoking status: Never Smoker   . Smokeless tobacco: Never Used  . Alcohol Use: 0.0 oz/week    0 Standard drinks or equivalent per week     Comment: rarely  . Drug Use: No  . Sexual Activity: Not on file   Other Topics Concern  . Not on file   Social History Narrative    Past Surgical History  Procedure Laterality Date  . Cesarean section  01/31/2006    Family History  Problem Relation Age of Onset    . Hypertension Father     Allergies  Allergen Reactions  . Azithromycin     REACTION: [place]  . Penicillins     [place]  . Prednisone     [place] and short of breath    Current Outpatient Prescriptions on File Prior to Visit  Medication Sig Dispense Refill  . cetirizine (ZYRTEC) 10 MG tablet Take 10 mg by mouth daily.    . Norethindrone Acet-Ethinyl Est (LOESTRIN 1.5/30, 21,) 1.5-30 MG-MCG TABS Take 1 tablet by mouth daily.      . pseudoephedrine-acetaminophen (TYLENOL SINUS) 30-500 MG TABS Take 1 tablet by mouth every 4 (four) hours as needed. Reported on 11/06/2015     No current facility-administered medications on file prior to visit.    BP 124/84 mmHg  Pulse 79  Temp(Src) 97.9 F (36.6 C) (Oral)  Wt 200 lb 12.8 oz (91.082 kg)  SpO2 98%    Objective:   Physical Exam  Constitutional: She appears well-nourished.  HENT:  Right Ear: Tympanic membrane and ear canal normal.  Left Ear: Tympanic membrane and ear canal normal.  Nose: Right sinus exhibits no maxillary sinus tenderness and no frontal sinus tenderness. Left sinus exhibits no maxillary sinus tenderness and no frontal sinus tenderness.  Mouth/Throat: Oropharynx is clear and moist.  Eyes: Conjunctivae are normal.  Neck: Neck supple.  Cardiovascular: Normal rate and regular rhythm.   Pulmonary/Chest:  Effort normal and breath sounds normal.  Mild rhonchi to upper lobes bilaterally.   Lymphadenopathy:    She has no cervical adenopathy.  Skin: Skin is warm and dry.          Assessment & Plan:

## 2015-11-07 ENCOUNTER — Ambulatory Visit: Payer: BLUE CROSS/BLUE SHIELD | Admitting: Primary Care

## 2016-07-02 ENCOUNTER — Ambulatory Visit (INDEPENDENT_AMBULATORY_CARE_PROVIDER_SITE_OTHER): Payer: BLUE CROSS/BLUE SHIELD | Admitting: Family Medicine

## 2016-07-02 ENCOUNTER — Encounter: Payer: Self-pay | Admitting: Family Medicine

## 2016-07-02 VITALS — BP 114/68 | HR 81 | Temp 97.9°F | Wt 203.2 lb

## 2016-07-02 DIAGNOSIS — J069 Acute upper respiratory infection, unspecified: Secondary | ICD-10-CM

## 2016-07-02 MED ORDER — DOXYCYCLINE HYCLATE 100 MG PO TABS: 100.0000 mg | ORAL_TABLET | Freq: Two times a day (BID) | ORAL | 0 refills | Status: DC

## 2016-07-02 NOTE — Progress Notes (Signed)
Pre visit review using our clinic review tool, if applicable. No additional management support is needed unless otherwise documented below in the visit note. 

## 2016-07-02 NOTE — Progress Notes (Signed)
SUBJECTIVE:  Christina [name] is a 40 y.o. female who complains of coryza, congestion, sore throat and nasal blockage for 7 days. She denies a history of anorexia and chest pain and denies a history of asthma. Patient denies smoke cigarettes.  PMH significant for allergy to PCNs and Azithromycin.  Has tolerated doxycyline well in the past.    Current Outpatient Prescriptions on File Prior to Visit  Medication Sig Dispense Refill  . cetirizine (ZYRTEC) 10 MG tablet Take 10 mg by mouth daily.    . Norethindrone Acet-Ethinyl Est (LOESTRIN 1.5/30, 21,) 1.5-30 MG-MCG TABS Take 1 tablet by mouth daily.      . pseudoephedrine-acetaminophen (TYLENOL SINUS) 30-500 MG TABS Take 1 tablet by mouth every 4 (four) hours as needed. Reported on 11/06/2015     No current facility-administered medications on file prior to visit.     Allergies  Allergen Reactions  . Azithromycin     REACTION: [place]  . Penicillins     [place]  . Prednisone     [place] and short of breath    Past Medical History:  Diagnosis Date  . Allergy   . Family planning     Past Surgical History:  Procedure Laterality Date  . CESAREAN SECTION  01/31/2006    Family History  Problem Relation Age of Onset  . Hypertension Father     Social History   Social History  . Marital status: Married    Spouse name: N/A  . Number of children: 1  . Years of education: N/A   Occupational History  . Working for [employer]    Social History Main Topics  . Smoking status: Never Smoker  . Smokeless tobacco: Never Used  . Alcohol use 0.0 oz/week     Comment: rarely  . Drug use: No  . Sexual activity: Not on file   Other Topics Concern  . Not on file   Social History Narrative  . No narrative on file   The PMH, PSH, Social History, Family History, Medications, and allergies have been reviewed in [facility], and have been updated if relevant.  OBJECTIVE: BP 114/68   Pulse 81   Temp 97.9 F (36.6 C) (Oral)   Wt 203 lb 4 oz (92.2 kg)    SpO2 98%   BMI 33.06 kg/m   She appears well, vital signs are as noted. Ears normal.  Throat and pharynx normal.  Neck supple. No adenopathy in the neck. Nose is congested. Sinuses tender. The chest is clear, without wheezes or rales.  ASSESSMENT:  viral upper respiratory illness and sinusitis  PLAN: Symptomatic therapy suggested: push fluids, rest and return office visit prn if symptoms persist or worsen. Lack of antibiotic effectiveness discussed with her. Call or return to clinic prn if these symptoms worsen or fail to improve as anticipated.

## 2016-11-10 ENCOUNTER — Ambulatory Visit (INDEPENDENT_AMBULATORY_CARE_PROVIDER_SITE_OTHER): Payer: BLUE CROSS/BLUE SHIELD | Admitting: Family Medicine

## 2016-11-10 ENCOUNTER — Encounter: Payer: Self-pay | Admitting: Family Medicine

## 2016-11-10 ENCOUNTER — Encounter (INDEPENDENT_AMBULATORY_CARE_PROVIDER_SITE_OTHER): Payer: Self-pay

## 2016-11-10 VITALS — BP 136/92 | HR 66 | Temp 97.8°F | Ht 66.0 in | Wt 184.0 lb

## 2016-11-10 DIAGNOSIS — J0110 Acute frontal sinusitis, unspecified: Secondary | ICD-10-CM

## 2016-11-10 MED ORDER — DOXYCYCLINE HYCLATE 100 MG PO TABS: 100.0000 mg | ORAL_TABLET | Freq: Two times a day (BID) | ORAL | 0 refills | Status: DC

## 2016-11-10 NOTE — Patient Instructions (Signed)
Great to see you!  Take antibiotic as directed.  Drink lots of fluids.    Treat sympotmatically with Mucinex, nasal saline irrigation, and Tylenol/Ibuprofen.   You can use warm compresses.     Call if not improving as expected in 5-7 days.

## 2016-11-10 NOTE — Progress Notes (Signed)
SUBJECTIVE:  Christina [name] is a 40 y.o. female who complains of coryza, sore throat and bilateral sinus pain for 7 days. She denies a history of anorexia and chills and denies a history of asthma. Patient denies smoke cigarettes.   Current Outpatient Prescriptions on File Prior to Visit  Medication Sig Dispense Refill  . cetirizine (ZYRTEC) 10 MG tablet Take 10 mg by mouth daily.    . Norethindrone Acet-Ethinyl Est (LOESTRIN 1.5/30, 21,) 1.5-30 MG-MCG TABS Take 1 tablet by mouth daily.      . pseudoephedrine-acetaminophen (TYLENOL SINUS) 30-500 MG TABS Take 1 tablet by mouth every 4 (four) hours as needed. Reported on 11/06/2015     No current facility-administered medications on file prior to visit.     Allergies  Allergen Reactions  . Azithromycin     REACTION: [place]  . Penicillins     [place]  . Prednisone     [place] and short of breath    Past Medical History:  Diagnosis Date  . Allergy   . Family planning     Past Surgical History:  Procedure Laterality Date  . CESAREAN SECTION  01/31/2006    Family History  Problem Relation Age of Onset  . Hypertension Father     Social History   Social History  . Marital status: Married    Spouse name: N/A  . Number of children: 1  . Years of education: N/A   Occupational History  . Working for [employer] firm    Social History Main Topics  . Smoking status: Never Smoker  . Smokeless tobacco: Never Used  . Alcohol use 0.0 oz/week     Comment: rarely  . Drug use: No  . Sexual activity: Not on file   Other Topics Concern  . Not on file   Social History Narrative  . No narrative on file   The PMH, PSH, Social History, Family History, Medications, and allergies have been reviewed in [facility], and have been updated if relevant.  OBJECTIVE: BP (!) 136/92   Pulse 66   Temp 97.8 F (36.6 C)   Ht 5\' 6"  (1.676 m)   Wt 184 lb (83.5 kg)   SpO2 98%   BMI 29.70 kg/m   Wt Readings from Last 3 Encounters:  11/10/16 184 lb (83.5 kg)   07/02/16 203 lb 4 oz (92.2 kg)  11/06/15 200 lb 12.8 oz (91.1 kg)    She appears well, vital signs are as noted. Ears normal.  Throat and pharynx normal.  Neck supple. No adenopathy in the neck. Nose is congested. Sinuses non tender. The chest is clear, without wheezes or rales.  ASSESSMENT:  sinusitis  PLAN: Given duration and progression of symptoms, will treat for bacterial sinusitis.  Symptomatic therapy suggested: push fluids, rest and return office visit prn if symptoms persist or worsen. Call or return to clinic prn if these symptoms worsen or fail to improve as anticipated.

## 2017-09-15 ENCOUNTER — Encounter (INDEPENDENT_AMBULATORY_CARE_PROVIDER_SITE_OTHER): Payer: Self-pay

## 2017-09-15 ENCOUNTER — Encounter: Payer: Self-pay | Admitting: Family Medicine

## 2017-09-15 ENCOUNTER — Ambulatory Visit: Payer: 59 | Admitting: Family Medicine

## 2017-09-15 VITALS — BP 130/74 | HR 77 | Temp 98.2°F | Wt 164.0 lb

## 2017-09-15 DIAGNOSIS — J0110 Acute frontal sinusitis, unspecified: Secondary | ICD-10-CM | POA: Diagnosis not present

## 2017-09-15 MED ORDER — DOXYCYCLINE HYCLATE 100 MG PO CAPS: 100.0000 mg | ORAL_CAPSULE | Freq: Two times a day (BID) | ORAL | 0 refills | Status: DC

## 2017-09-15 NOTE — Patient Instructions (Signed)
For nasal congestion you can use Afrin nasal spray for 3 days max, Sudafed, saline nasal spray (generic is fine for all). For cough you can try Delsym. Drink enough fluids to make your urine light yellow. For fever/chill/muscle aches you can take over the counter acetaminophen or ibuprofen.  Please come back in if you are not better in 5-7 days or if you develop wheezing, shortness of breath or persistent vomiting.  If not better in 4-5 days, can start antibiotic.

## 2017-09-15 NOTE — Progress Notes (Signed)
   Subjective:    Patient ID: Christina [name], female    DOB: Oct 31, 1975, 42 y.o.   MRN: [id number]  HPI This is a 42 yo female who presents today with sinus pressure x 10 days, had previously that got better. Little nasal drainage. Taking Advil sinus, little relief. No fever. No ear pain, no sore throat, no cough or SOB. Energy level ok, no fatigue.  Takes Zyrtec daily. Good water intake.  Taking OCP, no missed doses.   Past Medical History:  Diagnosis Date  . Allergy   . Family planning    Past Surgical History:  Procedure Laterality Date  . CESAREAN SECTION  01/31/2006   Family History  Problem Relation Age of Onset  . Hypertension Father    Social History   Tobacco Use  . Smoking status: Never Smoker  . Smokeless tobacco: Never Used  Substance Use Topics  . Alcohol use: Yes    Alcohol/week: 0.0 oz    Comment: rarely  . Drug use: No      Review of Systems Per HPI    Objective:   Physical Exam  Constitutional: She is oriented to person, place, and time. She appears well-developed and well-nourished. No distress.  HENT:  Head: Normocephalic and atraumatic.  Right Ear: Tympanic membrane, external ear and ear canal normal.  Left Ear: Tympanic membrane, external ear and ear canal normal.  Nose: Nose normal.  Mouth/Throat: Uvula is midline and mucous membranes are normal. No oropharyngeal exudate, posterior oropharyngeal edema or posterior oropharyngeal erythema.  1+ tonsils (chronic per patient).   Eyes: Conjunctivae are normal.  Neck: Normal range of motion. Neck supple.  Cardiovascular: Normal rate, regular rhythm and normal heart sounds.  Pulmonary/Chest: Effort normal and breath sounds normal.  Lymphadenopathy:    She has no cervical adenopathy.  Neurological: She is alert and oriented to person, place, and time.  Skin: Skin is warm and dry. She is not diaphoretic.  Psychiatric: She has a normal mood and affect. Her behavior is normal. Judgment and thought  content normal.  Vitals reviewed.     BP 130/74   Pulse 77   Temp 98.2 F (36.8 C) (Oral)   Wt 164 lb (74.4 kg)   LMP 09/01/2017 (Approximate)   SpO2 97%   BMI 26.47 kg/m  Wt Readings from Last 3 Encounters:  09/15/17 164 lb (74.4 kg)  11/10/16 184 lb (83.5 kg)  07/02/16 203 lb 4 oz (92.2 kg)       Assessment & Plan:  1. Acute non-recurrent frontal sinusitis - likely viral, provided wait and see antibiotic (doxy) - Provided written and verbal information regarding diagnosis and treatment. -  Patient Instructions  For nasal congestion you can use Afrin nasal spray for 3 days max, Sudafed, saline nasal spray (generic is fine for all). For cough you can try Delsym. Drink enough fluids to make your urine light yellow. For fever/chill/muscle aches you can take over the counter acetaminophen or ibuprofen.  Please come back in if you are not better in 5-7 days or if you develop wheezing, shortness of breath or persistent vomiting.  If not better in 4-5 days, can start antibiotic.    Clarene Reamer, FNP-BC  [facility], [facility]  09/15/2017 8:53 AM

## 2017-09-29 ENCOUNTER — Ambulatory Visit: Payer: Self-pay

## 2017-09-29 NOTE — Telephone Encounter (Signed)
Patient needs to schedule an office visit if symptoms still persisting after antibiotic.

## 2017-09-29 NOTE — Telephone Encounter (Signed)
Pt calling with "head pressure and runny nose". Pt experiencing H/A, especially in the am, rated 6-7 on a scale 1-10. Pt has been taking Advil with slight relief. No fever. Has been having runny nose for 10 days. Pt was seen in office 09/15/17 with sinus pressure x 10 days and was Rx Doxycycline which she began taking 09/18/17 to 09/24/17. Pt is still having sx. Skyped Rena at [facility] to see if need to make an appt or could Rx get called in. Asked by Mearl Latin to write up triage call and route back to office. Pt informed of plan and awaits phone call about providers decision. Care advise given to pt to help in the meantime. Routing note back to PCP.   Reason for Disposition . [1] Taking antibiotic > 7 days AND [2] nasal discharge not improved  Answer Assessment - Initial Assessment Questions 1. ANTIBIOTIC: "What antibiotic are you receiving?" "How many times per day?"     Finished ABX (doxycline) 09/24/17 2. ONSET: "When was the antibiotic started?"     09/18/17 3. PAIN: "How bad is the sinus pain?"   (Scale 1-10; mild, moderate or severe)   - MILD (1-3): doesn't interfere with normal activities    - MODERATE (4-7): interferes with normal activities (e.g., work or school) or awakens from sleep   - SEVERE (8-10): excruciating pain and patient unable to do any normal activities        Moderate 6-7 4. FEVER: "Do you have a fever?" If so, ask: "What is it, how was it measured, and when did it start?"      no 5. SYMPTOMS: "Are there any other symptoms you're concerned about?" If so, ask: "When did it start?"     Runny nose x 10 days 6. PREGNANCY: "Is there any chance you are pregnant?" "When was your last menstrual period?"     No LMP: 3 weeks ago  Protocols used: SINUS INFECTION ON ANTIBIOTIC FOLLOW-UP CALL-A-AH

## 2017-09-30 NOTE — Telephone Encounter (Signed)
Left detailed message on vm per dpr 

## 2017-10-01 ENCOUNTER — Ambulatory Visit: Payer: 59 | Admitting: Family Medicine

## 2017-10-01 ENCOUNTER — Encounter: Payer: Self-pay | Admitting: Family Medicine

## 2017-10-01 ENCOUNTER — Other Ambulatory Visit: Payer: Self-pay

## 2017-10-01 VITALS — BP 120/82 | HR 72 | Temp 98.3°F | Ht 66.0 in | Wt 165.0 lb

## 2017-10-01 DIAGNOSIS — J3489 Other specified disorders of nose and nasal sinuses: Secondary | ICD-10-CM | POA: Diagnosis not present

## 2017-10-01 DIAGNOSIS — J329 Chronic sinusitis, unspecified: Secondary | ICD-10-CM | POA: Insufficient documentation

## 2017-10-01 NOTE — Patient Instructions (Signed)
Continue nasal saline spray 2-3 times daily or saline irrigation.  Start nasal flonase OTC 2 sprays per nostril daily.  Call if increase in pain, new fever, shortness of breath or not improiving in next week.

## 2017-10-01 NOTE — Progress Notes (Signed)
   Subjective:    Patient ID: Christina [name], female    DOB: 1976-02-22, 42 y.o.   MRN: [id number]  HPI  42 year old female present with continue sinusitis symptoms. She was seen by  NP Carlean Purl on 09/15/2017. Felt likely viral but given doxy to use if not improving. Afrin nasal spray for 3 days max, Sudafed, saline nasal spray, delsym for symptoms. She complete course of doxy  X 7 days.  She noted no improvement after antibiotics. She has significant pressure and pain in bilateral frontal and maxillary sinuses.  No ear pain, no fever., no cough, mild ST in AM, mild post nasal drip    On zyrtec.  Hx of sinus issue, no past sinus surgery.  Review of Systems  Constitutional: Negative for fatigue and fever.  HENT: Negative for congestion.   Eyes: Negative for pain.  Respiratory: Negative for cough and shortness of breath.   Cardiovascular: Negative for chest pain, palpitations and leg swelling.  Gastrointestinal: Negative for abdominal pain.  Genitourinary: Negative for dysuria and vaginal bleeding.  Musculoskeletal: Negative for back pain.  Neurological: Negative for syncope, light-headedness and headaches.  Psychiatric/Behavioral: Negative for dysphoric mood.       Objective:   Physical Exam  Constitutional: Vital signs are normal. She appears well-developed and well-nourished. She is cooperative.  Non-toxic appearance. She does not appear ill. No distress.  HENT:  Head: Normocephalic.  Right Ear: Hearing, tympanic membrane, external ear and ear canal normal. Tympanic membrane is not erythematous, not retracted and not bulging.  Left Ear: Hearing, tympanic membrane, external ear and ear canal normal. Tympanic membrane is not erythematous, not retracted and not bulging.  Nose: Mucosal edema and rhinorrhea present. Right sinus exhibits no maxillary sinus tenderness and no frontal sinus tenderness. Left sinus exhibits no maxillary sinus tenderness and no frontal sinus tenderness.    Mouth/Throat: Uvula is midline, oropharynx is clear and moist and mucous membranes are normal.  Eyes: Conjunctivae, EOM and lids are normal. Pupils are equal, round, and reactive to light. Lids are everted and swept, no foreign bodies found.  Neck: Trachea normal and normal range of motion. Neck supple. Carotid bruit is not present. No thyroid mass and no thyromegaly present.  Cardiovascular: Normal rate, regular rhythm, S1 normal, S2 normal, normal heart sounds, intact distal pulses and normal pulses. Exam reveals no gallop and no friction rub.  No murmur heard. Pulmonary/Chest: Effort normal and breath sounds normal. No tachypnea. No respiratory distress. She has no decreased breath sounds. She has no wheezes. She has no rhonchi. She has no rales.  Neurological: She is alert.  Skin: Skin is warm, dry and intact. No Lege noted.  Psychiatric: Her speech is normal and behavior is normal. Judgment normal. Her mood appears not anxious. Cognition and memory are normal. She does not exhibit a depressed mood.          Assessment & Plan:

## 2017-10-01 NOTE — Assessment & Plan Note (Addendum)
No clear continued bacterial infection. Likely residual inflammation in sinuses  +/-  allergies   Treat with saline irrigation and topical steroid given SE in past to oral prednisone.

## 2017-10-05 ENCOUNTER — Ambulatory Visit: Payer: Self-pay | Admitting: *Deleted

## 2017-10-05 NOTE — Telephone Encounter (Signed)
   Reason for Disposition . Caller has NON-URGENT medication question about med that PCP prescribed and triager unable to answer question  Answer Assessment - Initial Assessment Questions 1. SYMPTOMS: "Do you have any symptoms?"     Nose bleed 2. SEVERITY: If symptoms are present, ask "Are they mild, moderate or severe?"     Patient was given Flonase for nasal inflammation. She started Saturday with her first dose. This early this morning she had a 45 minute nose bleed and another slight one just now. She did not use Flonase today. She still has some sinus pressure and pain. Patient advised not to use Flonase anymore. Will let provider know and will ask about any other treatment needed/advised.  Protocols used: MEDICATION QUESTION CALL-A-AH

## 2017-10-05 NOTE — Telephone Encounter (Signed)
Let pt know to stop flonase and instead use nasal saline.  May be having irritation from flonase.

## 2017-10-05 NOTE — Telephone Encounter (Signed)
Kaitlin notified as instructed by telephone.   

## 2017-10-21 ENCOUNTER — Ambulatory Visit: Payer: BLUE CROSS/BLUE SHIELD | Admitting: Internal Medicine

## 2017-11-09 ENCOUNTER — Ambulatory Visit: Payer: 59 | Admitting: Primary Care

## 2017-11-09 ENCOUNTER — Encounter: Payer: Self-pay | Admitting: Primary Care

## 2017-11-09 DIAGNOSIS — J329 Chronic sinusitis, unspecified: Secondary | ICD-10-CM | POA: Diagnosis not present

## 2017-11-09 NOTE — Progress Notes (Signed)
Subjective:    Patient ID: Christina [name], female    DOB: 12/01/1975, 42 y.o.   MRN: [id number]  HPI  Christina [name] is a 42 year old female who presents today to transfer care from Dr. Deborra Medina. She has no complaints today. She sees her gynecologist annually for CPE.  1) Recurrent Sinusitis: History of for years. Infections occur every several months on average and are mostly mild. Most of the time she'll treat her symptoms with OTC Advil Sinus with resolve. She doesn't use nasal sprays as they cause bleeding to her nasal cavity. She is managed on Claritin daily for allergic rhinitis.   Review of Systems  Constitutional: Negative for fever.  HENT: Positive for postnasal drip. Negative for congestion and sinus pressure.   Respiratory: Negative for cough and wheezing.   Allergic/Immunologic: Positive for environmental allergies.       Past Medical History:  Diagnosis Date  . Allergy   . Family planning   . Recurrent sinusitis      Social History   Socioeconomic History  . Marital status: Married    Spouse name: Not on file  . Number of children: 1  . Years of education: Not on file  . Highest education level: Not on file  Social Needs  . Financial resource strain: Not on file  . Food insecurity - worry: Not on file  . Food insecurity - inability: Not on file  . Transportation needs - medical: Not on file  . Transportation needs - non-medical: Not on file  Occupational History  . Occupation: Working for [employer] firm  Tobacco Use  . Smoking status: Never Smoker  . Smokeless tobacco: Never Used  Substance and Sexual Activity  . Alcohol use: Yes    Alcohol/week: 0.0 oz    Comment: rarely  . Drug use: No  . Sexual activity: Not on file  Other Topics Concern  . Not on file  Social History Narrative   Married.   1 child.   Works for [employer] firm in [place].   Enjoys reading, spending time with her daughter, doing martial arts.    Past Surgical History:  Procedure Laterality  Date  . CESAREAN SECTION  01/31/2006    Family History  Problem Relation Age of Onset  . Hypertension Father   . Stroke Father   . Skin cancer Mother   . Diabetes Paternal Grandmother   . Breast cancer Paternal Grandmother     Allergies  Allergen Reactions  . Azithromycin     REACTION: Kerper  . Penicillins     Carolan  . Prednisone     Laskowski and short of breath    Current Outpatient Medications on File Prior to Visit  Medication Sig Dispense Refill  . ibuprofen (ADVIL,MOTRIN) 200 MG tablet Take 400 mg by mouth every 6 (six) hours as needed.    . loratadine (CLARITIN) 10 MG tablet Take 10 mg by mouth daily.    . Norethindrone Acet-Ethinyl Est (LOESTRIN 1.5/30, 21,) 1.5-30 MG-MCG TABS Take 1 tablet by mouth daily.      . pseudoephedrine (SUDAFED) 30 MG tablet Take 30 mg by mouth every 4 (four) hours as needed for congestion.     No current facility-administered medications on file prior to visit.     BP 126/82   Pulse 79   Temp 97.8 F (36.6 C) (Oral)   Wt 161 lb 8 oz (73.3 kg)   SpO2 98%   BMI 26.07 kg/m    Objective:  Physical Exam  Constitutional: She appears well-nourished.  HENT:  Nose: Right sinus exhibits no maxillary sinus tenderness and no frontal sinus tenderness. Left sinus exhibits no maxillary sinus tenderness and no frontal sinus tenderness.  Mouth/Throat: Oropharynx is clear and moist.  Eyes: Conjunctivae are normal.  Neck: Neck supple.  Cardiovascular: Normal rate and regular rhythm.  Pulmonary/Chest: Effort normal and breath sounds normal. She has no wheezes. She has no rales.  Lymphadenopathy:    She has no cervical adenopathy.  Skin: Skin is warm and dry.          Assessment & Plan:

## 2017-11-09 NOTE — Assessment & Plan Note (Signed)
History of every several months, mostly treats with OTC medication with resolve. Continue daily claritin. Discussed sinus rinses in the future. Follow up PRN.

## 2017-11-09 NOTE — Patient Instructions (Signed)
Continue daily Claritin.  Please notify me if you need anything! It was a pleasure meeting you!

## 2017-12-27 ENCOUNTER — Ambulatory Visit: Payer: 59 | Admitting: Family

## 2017-12-27 ENCOUNTER — Ambulatory Visit (INDEPENDENT_AMBULATORY_CARE_PROVIDER_SITE_OTHER)
Admission: RE | Admit: 2017-12-27 | Discharge: 2017-12-27 | Disposition: A | Discharge status: Home / Self Care | Payer: 59 | Source: Ambulatory Visit | Attending: Family | Admitting: Family

## 2017-12-27 ENCOUNTER — Encounter: Payer: Self-pay | Admitting: Family

## 2017-12-27 VITALS — BP 118/80 | HR 76 | Temp 98.4°F | Ht 66.0 in | Wt 162.1 lb

## 2017-12-27 DIAGNOSIS — M79672 Pain in left foot: Secondary | ICD-10-CM | POA: Diagnosis not present

## 2017-12-27 DIAGNOSIS — S9032XA Contusion of left foot, initial encounter: Secondary | ICD-10-CM | POA: Diagnosis not present

## 2017-12-27 MED ORDER — NAPROXEN 500 MG PO TABS: 500.0000 mg | ORAL_TABLET | Freq: Two times a day (BID) | ORAL | 0 refills | Status: DC

## 2017-12-27 NOTE — Progress Notes (Signed)
Christina [name] is a 42 y.o. female with the following history as recorded in EpicCare:  Patient Active Problem List   Diagnosis Date Noted  . Recurrent sinusitis 10/01/2017    Current Outpatient Medications  Medication Sig Dispense Refill  . ibuprofen (ADVIL,MOTRIN) 200 MG tablet Take 400 mg by mouth every 6 (six) hours as needed.    Lenda Kelp 1/20 1-20 MG-MCG tablet Take 1 tablet by mouth daily.  3  . loratadine (CLARITIN) 10 MG tablet Take 10 mg by mouth daily.    . Norethindrone Acet-Ethinyl Est (LOESTRIN 1.5/30, 21,) 1.5-30 MG-MCG TABS Take 1 tablet by mouth daily.      . naproxen (NAPROSYN) 500 MG tablet Take 1 tablet (500 mg total) by mouth 2 (two) times daily with a meal. 30 tablet 0  . pseudoephedrine (SUDAFED) 30 MG tablet Take 30 mg by mouth every 4 (four) hours as needed for congestion.     No current facility-administered medications for this visit.     Allergies: Azithromycin; Penicillins; and Prednisone  Past Medical History:  Diagnosis Date  . Allergy   . Family planning   . Recurrent sinusitis     Past Surgical History:  Procedure Laterality Date  . CESAREAN SECTION  01/31/2006    Family History  Problem Relation Age of Onset  . Hypertension Father   . Stroke Father   . Skin cancer Mother   . Diabetes Paternal Grandmother   . Breast cancer Paternal Grandmother     Social History   Tobacco Use  . Smoking status: Never Smoker  . Smokeless tobacco: Never Used  Substance Use Topics  . 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  Head: Normocephalic and atraumatic  Lungs: Respirations unlabored; clear to auscultation bilaterally without wheeze, rales, rhonchi  Musculoskeletal: No deformities; marked swelling and bruising at base of 1st toe left foot  Extremities: No edema, cyanosis, clubbing  Vessels: Symmetric bilaterally  Neurologic: Alert and oriented; speech intact; face symmetrical; moves all extremities well; CNII-XII intact without focal deficit  Assessment:  1. Left foot pain     Plan:  STAT X-ray shows possible fracture; reviewed with Dr. Raeford Razor; patient is placed in post-op shoe and will see him in follow-up in 1 week; Rx for Naproxen 500 mg bid; encouraged rest, elevation and ice as needed;   Return in about 1 week (around 01/03/2018) for with Dr.  Raeford Razor for foot follow-up.  Orders Placed This Encounter  Procedures  . DG Foot Complete Left    Standing Status:   Future    Number of Occurrences:   1    Standing Expiration Date:   02/27/2019    Order Specific Question:   Reason for Exam (SYMPTOM  OR DIAGNOSIS REQUIRED)    Answer:   foot injury/ pain    Order Specific Question:   Is patient  pregnant?    Answer:   No    Order Specific Question:   Preferred imaging location?    Answer:   [facility]    Order Specific Question:   Radiology Contrast Protocol - do NOT remove file path    Answer:   [email or web address]    Requested Prescriptions   Signed Prescriptions Disp Refills  . naproxen (NAPROSYN) 500 MG tablet 30 tablet 0    Sig: Take 1 tablet (500 mg total) by mouth 2 (two) times daily with a meal.

## 2018-01-06 ENCOUNTER — Encounter: Payer: Self-pay | Admitting: Family Medicine

## 2018-01-06 ENCOUNTER — Ambulatory Visit: Payer: 59 | Admitting: Family Medicine

## 2018-01-06 DIAGNOSIS — M79675 Pain in left toe(s): Secondary | ICD-10-CM | POA: Diagnosis not present

## 2018-01-06 NOTE — Progress Notes (Signed)
Christina [name] - 42 y.o. female MRN [id number]  Date of birth: 01-25-1976  SUBJECTIVE:  Including CC & ROS.  Chief Complaint  Patient presents with  . Left foot pain    Christina [name] is a 42 y.o. female that is presenting with left foot pain. Pain is occurring on the dorsal aspect of the left great toe. She . She has been in a post op shoe daily.  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 Pain has been improving. She is taking Naproxen.   Independent review of the left foot xray from 5/6 shows no fracture but likely bipartite sesamoid bone.    Review of Systems  Constitutional: Negative for fever.  HENT: Negative for congestion.   Respiratory: Negative for cough.   Cardiovascular: Negative for chest pain.  Gastrointestinal: Negative for abdominal pain.  Musculoskeletal: Positive for gait problem.  Skin: Positive for color change.  Neurological: Negative for weakness.  Hematological: Negative for adenopathy.  Psychiatric/Behavioral: Negative for agitation.    HISTORY: Past Medical, Surgical, Social, and Family History Reviewed & Updated per EMR.   Pertinent Historical Findings include:  Past Medical History:  Diagnosis Date  . Allergy   . Family planning   . Recurrent sinusitis     Past Surgical History:  Procedure Laterality Date  . CESAREAN SECTION  01/31/2006    Allergies  Allergen Reactions  . Azithromycin     REACTION: Addis  . Penicillins     Dorey  . Prednisone     Zunker and short of breath    Family History  Problem Relation Age of Onset  . Hypertension Father   . Stroke Father   . Skin cancer Mother   . Diabetes Paternal Grandmother   . Breast cancer Paternal Grandmother      Social History   Socioeconomic History  . Marital status: Married    Spouse name: Not on file  . Number of children: 1  . Years of education: Not on file  . Highest education level: Not on file  Occupational History  .  Occupation: Working for [employer] firm  Social Needs  . Financial resource strain: Not on file  . Food insecurity:    Worry: Not on file    Inability: Not on file  . Transportation needs:    Medical: Not on file    Non-medical: Not on file  Tobacco Use  . Smoking status: Never Smoker  . Smokeless tobacco: Never Used  Substance and Sexual Activity  . Alcohol use: Yes    Alcohol/week: 0.0 oz    Comment: rarely  . Drug use: No  . Sexual activity: Not on file  Lifestyle  . Physical activity:    Days per week: Not on file    Minutes per session: Not on file  . Stress: Not on file  Relationships  . Social connections:    Talks on phone: Not on file    Gets together: Not on file    Attends religious service: Not on file    Active member of club or organization: Not on file    Attends meetings of clubs or organizations: Not on file    Relationship status: Not on file  . Intimate partner violence:    Fear of current or ex partner: Not on file    Emotionally abused: Not on file    Physically abused: Not on file    Forced sexual activity:  Not on file  Other Topics Concern  . Not on file  Social History Narrative   Married.   1 child.   Works for [employer] firm in [place].   Enjoys reading, spending time with her daughter, doing martial arts.     PHYSICAL EXAM:  VS: BP 132/76 (BP Location: Left Arm, Patient Position: Sitting, Cuff Size: Normal)   Pulse 71   Temp 98.2 F (36.8 C) (Oral)   Ht 5\' 6"  (1.676 m)   Wt 160 lb (72.6 kg)   SpO2 98%   BMI 25.82 kg/m  Physical Exam Gen: NAD, alert, cooperative with exam, well-appearing ENT: normal lips, normal nasal mucosa,  Eye: normal EOM, normal conjunctiva and lids CV:  no edema, +2 pedal pulses   Resp: no accessory muscle use, non-labored,  GI: no masses or tenderness, no hernia  Skin: no rashes, no areas of induration  Neuro: normal tone, normal sensation to touch Psych:  normal insight, alert and oriented MSK:  Left foot:    Ecchymosis on the dorsum on the left great toe.  Pain with flexion and extension of the great toe  No abnormal callus formation  Neurovascularly intact   Limited ultrasound: left foot:  Normal appearing 1st MTP joint  No fracture of the MT or of the proximal phalange    Summary: normal appearing exam   Ultrasound and interpretation by Clearance Coots, MD          ASSESSMENT & PLAN:   Great toe pain, left No fracture on [place] or xray. Having improvement with post op shoe. Possible for capsular strain.  - continue post op shoe  - counseled on supportive care - counseled on exercise  - f/u in 4 weeks.

## 2018-01-06 NOTE — Patient Instructions (Signed)
Nice to meet you  Please wear the post op shoe for one more week. Then you can wean out of it.  Please hold off on running or jumping if you have pain greater than 2/10 You can ride a bike or use the elliptical  Please follow up if your pain worsens.  Please follow up with me in 4 weeks.

## 2018-01-07 DIAGNOSIS — M79675 Pain in left toe(s): Secondary | ICD-10-CM | POA: Insufficient documentation

## 2018-01-07 NOTE — Assessment & Plan Note (Signed)
No fracture on [place] or xray. Having improvement with post op shoe. Possible for capsular strain.  - continue post op shoe  - counseled on supportive care - counseled on exercise  - f/u in 4 weeks.

## 2018-02-03 ENCOUNTER — Ambulatory Visit: Payer: 59 | Admitting: Family Medicine

## 2018-02-03 ENCOUNTER — Encounter: Payer: Self-pay | Admitting: Family Medicine

## 2018-02-03 DIAGNOSIS — M79675 Pain in left toe(s): Secondary | ICD-10-CM

## 2018-02-03 NOTE — Patient Instructions (Signed)
Good to see you  Try icing the area  Try wearing tennis shoes  Please start slow back to running  Please let me know if your pain hasn't improved in 4 weeks and we may need to order an MRI

## 2018-02-03 NOTE — Progress Notes (Signed)
Christina [name] - 42 y.o. female MRN [id number]  Date of birth: 1975/10/13  SUBJECTIVE:  Including CC & ROS.  Chief Complaint  Patient presents with  . Follow-up    Christina [name] is a 42 y.o. female that is here today for Left great toe pain follow up. She has been wearing the post op shoe daily with periods of walking on it without it on. Admits to some pain. Has been wearing the post op shoe since 5/6.   Xray on 5/6 was negative for fracture.    Review of Systems  Constitutional: Negative for fever.  HENT: Negative for congestion.   Respiratory: Negative for cough.   Musculoskeletal: Negative for joint swelling.  Skin: Negative for color change.    HISTORY: Past Medical, Surgical, Social, and Family History Reviewed & Updated per EMR.   Pertinent Historical Findings include:  Past Medical History:  Diagnosis Date  . Allergy   . Family planning   . Recurrent sinusitis     Past Surgical History:  Procedure Laterality Date  . CESAREAN SECTION  01/31/2006    Allergies  Allergen Reactions  . Azithromycin     REACTION: [place]  . Penicillins     [place]  . Prednisone     [place] and short of breath    Family History  Problem Relation Age of Onset  . Hypertension Father   . Stroke Father   . Skin cancer Mother   . Diabetes Paternal Grandmother   . Breast cancer Paternal Grandmother      Social History   Socioeconomic History  . Marital status: Married    Spouse name: Not on file  . Number of children: 1  . Years of education: Not on file  . Highest education level: Not on file  Occupational History  . Occupation: Working for [employer]  Social Needs  . Financial resource strain: Not on file  . Food insecurity:    Worry: Not on file    Inability: Not on file  . Transportation needs:    Medical: Not on file    Non-medical: Not on file  Tobacco Use  . Smoking status: Never Smoker  . Smokeless tobacco: Never Used  Substance and Sexual Activity  . Alcohol  use: Yes    Alcohol/week: 0.0 oz    Comment: rarely  . Drug use: No  . Sexual activity: Not on file  Lifestyle  . Physical activity:    Days per week: Not on file    Minutes per session: Not on file  . Stress: Not on file  Relationships  . Social connections:    Talks on phone: Not on file    Gets together: Not on file    Attends religious service: Not on file    Active member of club or organization: Not on file    Attends meetings of clubs or organizations: Not on file    Relationship status: Not on file  . Intimate partner violence:    Fear of current or ex partner: Not on file    Emotionally abused: Not on file    Physically abused: Not on file    Forced sexual activity: Not on file  Other Topics Concern  . Not on file  Social History Narrative   Married.   1 child.   Works for [employer] firm in [place].   Enjoys reading, spending time with her daughter, doing martial arts.     PHYSICAL EXAM:  VS: BP 116/73 (  BP Location: Left Arm, Patient Position: Sitting, Cuff Size: Normal)   Pulse 75   Temp 98.1 F (36.7 C) (Oral)   Ht 5\' 6"  (1.676 m)   SpO2 98%   BMI 25.82 kg/m  Physical Exam Gen: NAD, alert, cooperative with exam, well-appearing ENT: normal lips, normal nasal mucosa,  Eye: normal EOM, normal conjunctiva and lids CV:  no edema, +2 pedal pulses   Resp: no accessory muscle use, non-labored,  Skin: no rashes, no areas of induration  Neuro: normal tone, normal sensation to touch Psych:  normal insight, alert and oriented MSK:  Left foot:  Normal flexion and extension of the great toe  Mild tenderness to palpation over the dorsal first MTP joint. Some pain with extension of the first MTP joint. No overlying swelling or redness of the first MTP joint. Neurovascularly intact.     ASSESSMENT & PLAN:   Great toe pain, left Pain is still ongoing but minimal. Has been wearing the post op shoe since May 6  - transition out of the post op shoe. Try to wear a  tennis shoe for a while  - provided pennsaid samples  - if no improvement may need to MRI for evaluation of the 1st MTP joint or the lisfranc ligament.

## 2018-02-03 NOTE — Assessment & Plan Note (Signed)
Pain is still ongoing but minimal. Has been wearing the post op shoe since May 6  - transition out of the post op shoe. Try to wear a tennis shoe for a while  - provided pennsaid samples  - if no improvement may need to MRI for evaluation of the 1st MTP joint or the lisfranc ligament.

## 2018-02-08 DIAGNOSIS — Z0000 Encounter for general adult medical examination without abnormal findings: Secondary | ICD-10-CM | POA: Diagnosis not present

## 2018-02-08 DIAGNOSIS — Z1231 Encounter for screening mammogram for malignant neoplasm of breast: Secondary | ICD-10-CM | POA: Diagnosis not present

## 2018-02-08 DIAGNOSIS — Z01419 Encounter for gynecological examination (general) (routine) without abnormal findings: Secondary | ICD-10-CM | POA: Diagnosis not present

## 2018-02-08 DIAGNOSIS — Z131 Encounter for screening for diabetes mellitus: Secondary | ICD-10-CM | POA: Diagnosis not present

## 2018-02-08 DIAGNOSIS — Z13220 Encounter for screening for lipoid disorders: Secondary | ICD-10-CM | POA: Diagnosis not present

## 2018-09-06 ENCOUNTER — Ambulatory Visit: Payer: 59 | Admitting: Family Medicine

## 2018-09-06 ENCOUNTER — Encounter: Payer: Self-pay | Admitting: Family Medicine

## 2018-09-06 DIAGNOSIS — J329 Chronic sinusitis, unspecified: Secondary | ICD-10-CM

## 2018-09-06 MED ORDER — DOXYCYCLINE HYCLATE 100 MG PO TABS: 100.0000 mg | ORAL_TABLET | Freq: Two times a day (BID) | ORAL | 0 refills | Status: DC

## 2018-09-06 NOTE — Progress Notes (Signed)
duration of symptoms: about 2.5 weeks Rhinorrhea: no Congestion: yes- initial sx, then more sinus pressure.   ear pain: no sore throat: off and on, "not terrible" cough:no myalgias:no No FCNAVD.  Still with facial pain.  Chest feels fine.   She didn't need a note.   Taking OTC decongestants.    Per HPI unless specifically indicated in ROS section   Meds, vitals, and allergies reviewed.   GEN: nad, alert and oriented HEENT: mucous membranes moist, TM w/o erythema, nasal epithelium slightly irritated, OP wnl NECK: supple w/o LA CV: rrr. PULM: ctab, no inc wob EXT: no edema Sinuses ttp x4

## 2018-09-06 NOTE — Assessment & Plan Note (Signed)
Start doxycycline, use back up birth control.  Use nasal saline.  Rest and fluids.  Update [place] as needed.  She agrees.  Nontoxic.

## 2018-09-06 NOTE — Patient Instructions (Signed)
Start doxycycline, use back up birth control.  Use nasal saline.  Rest and fluids.  Update [place] as needed.  Take care.  Glad to see you.

## 2019-03-29 ENCOUNTER — Ambulatory Visit (INDEPENDENT_AMBULATORY_CARE_PROVIDER_SITE_OTHER): Payer: 59 | Admitting: Primary Care

## 2019-03-29 DIAGNOSIS — R7989 Other specified abnormal findings of blood chemistry: Secondary | ICD-10-CM | POA: Insufficient documentation

## 2019-03-29 NOTE — Assessment & Plan Note (Signed)
Noted on routine OB/GYN labs. TSH of 5.01. Family history of hypothyroidism in sister.  Given that she is asymptomatic and this is our only reference lab we will have her repeat labs in 4 weeks, add on free T4 and free T3.  She will update if anything changes.

## 2019-03-29 NOTE — Patient Instructions (Signed)
Call the main line to schedule a lab only appointment for 4 weeks from now.  It was a pleasure to see you today! Allie Bossier, NP-C

## 2019-03-29 NOTE — Progress Notes (Signed)
Subjective:    Patient ID: Erik Obey Mortellaro, female    DOB: 31-Dec-1975, 43 y.o.   MRN: [id number]  HPI  Virtual Visit via Video Note  I connected with Sharyn Lull L Ruminski on 03/29/19 at 11:40 AM EDT by a video enabled telemedicine application and verified that I am speaking with the correct person using two identifiers.  Location: Patient: Home Provider: Office   I discussed the limitations of evaluation and management by telemedicine and the availability of in person appointments. The patient expressed understanding and agreed to proceed.  History of Present Illness:  Ms. Batt is a 43 year old female who presents today to discuss abnormal thyroid function test.  She was evaluated by her OB/GYN recently for her annual exam. Her OB/GYN collected blood work which included TSH which resulted in a level of 5.01. She was directed to our office for further evaluation.   She has a family history of hypothyroidism in her sister who is now managed on oral thyroid hormones.  She denies fatigue, hair loss, cold/heat intolerance, constipation.  She feels well.   Observations/Objective:  Alert and oriented. Appears well, not sickly. No distress. Speaking in complete sentences.   Assessment and Plan:  See problem based charting.  Follow Up Instructions:  Call the main line to schedule a lab only appointment for 4 weeks from now.  It was a pleasure to see you today! Allie Bossier, NP-C    I discussed the assessment and treatment plan with the patient. The patient was provided an opportunity to ask questions and all were answered. The patient agreed with the plan and demonstrated an understanding of the instructions.   The patient was advised to call back or seek an in-person evaluation if the symptoms worsen or if the condition fails to improve as anticipated.     Pleas Koch, NP    Review of Systems  Constitutional: Negative for fatigue.  Gastrointestinal: Negative for  constipation.  Endocrine: Negative for cold intolerance and heat intolerance.  Skin:       Denies hair loss       Past Medical History:  Diagnosis Date  . Allergy   . Family planning   . Recurrent sinusitis      Social History   Socioeconomic History  . Marital status: Married    Spouse name: Not on file  . Number of children: 1  . Years of education: Not on file  . Highest education level: Not on file  Occupational History  . Occupation: Working for [employer]  Social Needs  . Financial resource strain: Not on file  . Food insecurity    Worry: Not on file    Inability: Not on file  . Transportation needs    Medical: Not on file    Non-medical: Not on file  Tobacco Use  . Smoking status: Never Smoker  . Smokeless tobacco: Never Used  Substance and Sexual Activity  . Alcohol use: Yes    Alcohol/week: 0.0 standard drinks    Comment: rarely  . Drug use: No  . Sexual activity: Not on file  Lifestyle  . Physical activity    Days per week: Not on file    Minutes per session: Not on file  . Stress: Not on file  Relationships  . Social Herbalist on phone: Not on file    Gets together: Not on file    Attends religious service: Not on file    Active  member of club or organization: Not on file    Attends meetings of clubs or organizations: Not on file    Relationship status: Not on file  . Intimate partner violence    Fear of current or ex partner: Not on file    Emotionally abused: Not on file    Physically abused: Not on file    Forced sexual activity: Not on file  Other Topics Concern  . Not on file  Social History Narrative   Married.   1 child.   Works for [employer] firm in [place].   Enjoys reading, spending time with her daughter, doing martial arts.    Past Surgical History:  Procedure Laterality Date  . CESAREAN SECTION  01/31/2006    Family History  Problem Relation Age of Onset  . Hypertension Father   . Stroke Father   . Skin cancer  Mother   . Diabetes Paternal Grandmother   . Breast cancer Paternal Grandmother     Allergies  Allergen Reactions  . Azithromycin     REACTION: [place]  . Penicillins     [place]  . Prednisone     [place] and short of breath    Current Outpatient Medications on File Prior to Visit  Medication Sig Dispense Refill  . JUNEL 1/20 1-20 MG-MCG tablet Take 1 tablet by mouth daily.  3  . loratadine (CLARITIN) 10 MG tablet Take 10 mg by mouth daily.     No current facility-administered medications on file prior to visit.     Wt 164 lb (74.4 kg)   BMI 26.47 kg/m    Objective:   Physical Exam  Constitutional: She is oriented to person, place, and time. She appears well-nourished.  Respiratory: Effort normal.  Neurological: She is alert and oriented to person, place, and time.  Psychiatric: She has a normal mood and affect.           Assessment & Plan:

## 2019-04-26 ENCOUNTER — Other Ambulatory Visit: Payer: Self-pay

## 2019-04-26 ENCOUNTER — Other Ambulatory Visit (INDEPENDENT_AMBULATORY_CARE_PROVIDER_SITE_OTHER): Payer: 59

## 2019-04-26 DIAGNOSIS — R7989 Other specified abnormal findings of blood chemistry: Secondary | ICD-10-CM

## 2019-04-26 LAB — TSH: TSH: 4 u[IU]/mL (ref 0.35–4.50)

## 2019-04-26 LAB — T4, FREE: Free T4: 0.83 ng/dL (ref 0.60–1.60)

## 2019-04-26 LAB — T3, FREE: T3, Free: 3.4 pg/mL (ref 2.3–4.2)

## 2019-05-23 ENCOUNTER — Other Ambulatory Visit: Payer: Self-pay

## 2019-05-23 ENCOUNTER — Ambulatory Visit: Payer: 59 | Admitting: Primary Care

## 2019-05-23 ENCOUNTER — Encounter: Payer: Self-pay | Admitting: Primary Care

## 2019-05-23 ENCOUNTER — Other Ambulatory Visit: Payer: Self-pay | Admitting: Primary Care

## 2019-05-23 DIAGNOSIS — D229 Melanocytic nevi, unspecified: Secondary | ICD-10-CM | POA: Insufficient documentation

## 2019-05-23 DIAGNOSIS — L820 Inflamed seborrheic keratosis: Secondary | ICD-10-CM | POA: Diagnosis not present

## 2019-05-23 NOTE — Assessment & Plan Note (Addendum)
Located to left lower back, noticeable for one week. Appears questionable, patient agrees with removal.  Consent obtained. Time out preformed.  Site cleansed with betadine solution. Site numbed with lidocaine 1% with Epi. Nevus removed with shave biopsy tool. Silver nitrate sticks used for minimal bleeding. Site cleansed. Dressing applied. Home care instructions provided. Patient tolerated well.

## 2019-05-23 NOTE — Patient Instructions (Signed)
Monitor the site for redness, swelling, drainage, foul smell.  We will be in touch with the pathology results once received.   It was a pleasure to see you today!

## 2019-05-23 NOTE — Progress Notes (Signed)
Subjective:    Patient ID: Christina [name], female    DOB: 25-Jun-1976, 43 y.o.   MRN: [id number]  HPI  [name] is a 43 year old female who presents today with a chief complaint of nevus.  She has a nevus that is located to the left lower back for which she first noticed one week ago. She is uncertain if the nevus has been present longer.   She denies pain, itching. She has a family history of skin cancer. She had a nevus removed 15 years ago, uncertain if it was precancerous.   Review of Systems  Constitutional: Negative for unexpected weight change.  Skin: Negative for color change.       nevus       Past Medical History:  Diagnosis Date  . Allergy   . Family planning   . Recurrent sinusitis      Social History   Socioeconomic History  . Marital status: Married    Spouse name: Not on file  . Number of children: 1  . Years of education: Not on file  . Highest education level: Not on file  Occupational History  . Occupation: Working for [employer]  Social Needs  . Financial resource strain: Not on file  . Food insecurity    Worry: Not on file    Inability: Not on file  . Transportation needs    Medical: Not on file    Non-medical: Not on file  Tobacco Use  . Smoking status: Never Smoker  . Smokeless tobacco: Never Used  Substance and Sexual Activity  . Alcohol use: Yes    Alcohol/week: 0.0 standard drinks    Comment: rarely  . Drug use: No  . Sexual activity: Not on file  Lifestyle  . Physical activity    Days per week: Not on file    Minutes per session: Not on file  . Stress: Not on file  Relationships  . Social Herbalist on phone: Not on file    Gets together: Not on file    Attends religious service: Not on file    Active member of club or organization: Not on file    Attends meetings of clubs or organizations: Not on file    Relationship status: Not on file  . Intimate partner violence    Fear of current or ex partner: Not on file   Emotionally abused: Not on file    Physically abused: Not on file    Forced sexual activity: Not on file  Other Topics Concern  . Not on file  Social History Narrative   Married.   1 child.   Works for [employer] firm in [place].   Enjoys reading, spending time with her daughter, doing martial arts.    Past Surgical History:  Procedure Laterality Date  . CESAREAN SECTION  01/31/2006    Family History  Problem Relation Age of Onset  . Hypertension Father   . Stroke Father   . Skin cancer Mother   . Diabetes Paternal Grandmother   . Breast cancer Paternal Grandmother     Allergies  Allergen Reactions  . Azithromycin     REACTION: [place]  . Penicillins     [place]  . Prednisone     [place] and short of breath    Current Outpatient Medications on File Prior to Visit  Medication Sig Dispense Refill  . JUNEL 1/20 1-20 MG-MCG tablet Take 1 tablet by mouth daily.  3  .  loratadine (CLARITIN) 10 MG tablet Take 10 mg by mouth daily.     No current facility-administered medications on file prior to visit.     BP 122/80   Pulse 68   Temp 97.8 F (36.6 C) (Temporal)   Ht 5\' 6"  (1.676 m)   Wt 168 lb 8 oz (76.4 kg)   SpO2 97%   BMI 27.20 kg/m    Objective:   Physical Exam  Constitutional: She appears well-nourished.  Cardiovascular: Normal rate.  Respiratory: Effort normal.  Skin: Skin is warm and dry.     0.75 cm oval shaped raised flesh colored nevus to left lower back. Some black speckling within texture of nevus.           Assessment & Plan:

## 2019-05-23 NOTE — Addendum Note (Signed)
Addended by: Jacqualin Combes on: 05/23/2019 12:34 PM   Modules accepted: Orders

## 2019-06-22 ENCOUNTER — Ambulatory Visit (INDEPENDENT_AMBULATORY_CARE_PROVIDER_SITE_OTHER): Payer: 59 | Admitting: Internal Medicine

## 2019-06-22 ENCOUNTER — Ambulatory Visit: Payer: 59 | Admitting: Internal Medicine

## 2019-06-22 VITALS — Temp 97.9°F

## 2019-06-22 DIAGNOSIS — J0140 Acute pansinusitis, unspecified: Secondary | ICD-10-CM

## 2019-06-22 MED ORDER — CEFDINIR 300 MG PO CAPS: 300.0000 mg | ORAL_CAPSULE | Freq: Two times a day (BID) | ORAL | 0 refills | Status: DC

## 2019-06-22 NOTE — Progress Notes (Deleted)
HPI: 43 y/o female complaining of nasal congestion

## 2019-06-22 NOTE — Progress Notes (Signed)
Patient ID: Christina [name], female   DOB: 11-16-1975, 43 y.o.   MRN: [id number] Virtual Visit via Video Note  I connected with Christina [name] on 06/22/19 at  2:00 PM EDT by a video enabled telemedicine application and verified that I am speaking with the correct person using two identifiers.  Location: Patient: Home Provider: Office   I discussed the limitations of evaluation and management by telemedicine and the availability of in person appointments. The patient expressed understanding and agreed to proceed.  History of Present Illness:   Patient complains of nasal congestion and facial pressure. This started 1-2 weeks ago. She is blowing clear/green mucous out of her nose. She also describes facial pressure behind her forehead and radiates down to her upper teeth. She denies headache, ear pain, runny nose, sore throat or cough. She denies fever, chills or body aches. She has tried Advil Cold and Sinus and Claritin with minimal relief. She has a history of sinus infections in the past and mentions she usually gets antibiotics when she starts feeling like this. She denies any sick contacts.      Past Medical History:  Diagnosis Date  . Allergy   . Family planning   . Recurrent sinusitis     Current Outpatient Medications  Medication Sig Dispense Refill  . JUNEL 1/20 1-20 MG-MCG tablet Take 1 tablet by mouth daily.  3  . loratadine (CLARITIN) 10 MG tablet Take 10 mg by mouth daily.    . cefdinir (OMNICEF) 300 MG capsule Take 1 capsule (300 mg total) by mouth 2 (two) times daily. 20 capsule 0   No current facility-administered medications for this visit.     Allergies  Allergen Reactions  . Azithromycin     REACTION: [place]  . Penicillins     [place]  . Prednisone     [place] and short of breath    Family History  Problem Relation Age of Onset  . Hypertension Father   . Stroke Father   . Skin cancer Mother   . Diabetes Paternal Grandmother   . Breast cancer Paternal  Grandmother     Social History   Socioeconomic History  . Marital status: Married    Spouse name: Not on file  . Number of children: 1  . Years of education: Not on file  . Highest education level: Not on file  Occupational History  . Occupation: Working for [employer]  Social Needs  . Financial resource strain: Not on file  . Food insecurity    Worry: Not on file    Inability: Not on file  . Transportation needs    Medical: Not on file    Non-medical: Not on file  Tobacco Use  . Smoking status: Never Smoker  . Smokeless tobacco: Never Used  Substance and Sexual Activity  . Alcohol use: Yes    Alcohol/week: 0.0 standard drinks    Comment: rarely  . Drug use: No  . Sexual activity: Not on file  Lifestyle  . Physical activity    Days per week: Not on file    Minutes per session: Not on file  . Stress: Not on file  Relationships  . Social Herbalist on phone: Not on file    Gets together: Not on file    Attends religious service: Not on file    Active member of club or organization: Not on file    Attends meetings of clubs or organizations: Not on file  Relationship status: Not on file  . Intimate partner violence    Fear of current or ex partner: Not on file    Emotionally abused: Not on file    Physically abused: Not on file    Forced sexual activity: Not on file  Other Topics Concern  . Not on file  Social History Narrative   Married.   1 child.   Works for [employer] firm in [place].   Enjoys reading, spending time with her daughter, doing martial arts.     Constitutional: Denies fever, malaise, fatigue, headache or abrupt weight changes.  HEENT: Pt reports facial pain and pressure, nasal congestion.  Denies eye redness, ringing in the ears, wax buildup, runny nose, bloody nose, or sore throat. Respiratory: Denies difficulty breathing, shortness of breath, cough or sputum production.   Cardiovascular: Denies chest pain, chest tightness, palpitations  or swelling in the hands or feet.      No other specific complaints in a complete review of systems (except as listed in HPI above).  Observations/Objective:  Temp 97.9 F (36.6 C) (Temporal)  Wt Readings from Last 3 Encounters:  05/23/19 168 lb 8 oz (76.4 kg)  03/29/19 164 lb (74.4 kg)  09/06/18 167 lb (75.8 kg)    General: Appears her stated age, in NAD. HEENT: Sounds mildly congested. Points to forehead and cheeks as the site of her facial pressure. Pulmonary/Chest: Normal respiratory effort and no respiratory distress.    BMET    Component Value Date/Time   NA 134 (L) 03/19/2011 0825   K 3.8 03/19/2011 0825   CL 100 03/19/2011 0825   CO2 27 03/19/2011 0825   GLUCOSE 93 03/19/2011 0825   BUN 8 03/19/2011 0825   CREATININE 0.8 03/19/2011 0825   CALCIUM 8.6 03/19/2011 0825    Lipid Panel  No results found for: CHOL, TRIG, HDL, CHOLHDL, VLDL, LDLCALC  CBC    Component Value Date/Time   WBC 21.9 cH (HH) 03/19/2011 0825   RBC 4.37 03/19/2011 0825   HGB 13.2 03/19/2011 0825   HCT 38.9 03/19/2011 0825   PLT 358.0 03/19/2011 0825   MCV 89.0 03/19/2011 0825   MCHC 34.0 03/19/2011 0825   RDW 13.1 03/19/2011 0825   LYMPHSABS 1.3 03/19/2011 0825   MONOABS 1.4 (H) 03/19/2011 0825   EOSABS 0.6 03/19/2011 0825   BASOSABS 0.0 03/19/2011 0825    Hgb A1C No results found for: HGBA1C     Assessment and Plan:  Acute Pansinusitis:  Can try a Neti Pot OTC Continue Claritin She reports Flonase causes nosebleeds RX for Omnicef 300 mg BID x 10 days  Return precautions discussed   Follow Up Instructions:    I discussed the assessment and treatment plan with the patient. The patient was provided an opportunity to ask questions and all were answered. The patient agreed with the plan and demonstrated an understanding of the instructions.   The patient was advised to call back or seek an in-person evaluation if the symptoms worsen or if the condition fails to  improve as anticipated.     Webb Silversmith, NP

## 2019-06-25 ENCOUNTER — Encounter: Payer: Self-pay | Admitting: Internal Medicine

## 2019-06-25 NOTE — Patient Instructions (Signed)

## 2019-07-03 ENCOUNTER — Encounter: Payer: Self-pay | Admitting: Internal Medicine

## 2019-07-05 DIAGNOSIS — J0140 Acute pansinusitis, unspecified: Secondary | ICD-10-CM

## 2019-07-14 MED ORDER — DOXYCYCLINE HYCLATE 100 MG PO TABS: 100.0000 mg | ORAL_TABLET | Freq: Two times a day (BID) | ORAL | 0 refills | Status: DC

## 2019-11-21 ENCOUNTER — Telehealth: Payer: Self-pay

## 2019-11-21 ENCOUNTER — Ambulatory Visit
Admission: RE | Admit: 2019-11-21 | Discharge: 2019-11-21 | Disposition: A | Discharge status: Home / Self Care | Payer: 59 | Source: Ambulatory Visit | Attending: Family Medicine | Admitting: Family Medicine

## 2019-11-21 ENCOUNTER — Other Ambulatory Visit: Payer: Self-pay

## 2019-11-21 ENCOUNTER — Ambulatory Visit: Payer: 59 | Admitting: Family Medicine

## 2019-11-21 ENCOUNTER — Telehealth: Payer: Self-pay | Admitting: Family Medicine

## 2019-11-21 VITALS — BP 118/84 | HR 63 | Temp 98.2°F | Wt 167.5 lb

## 2019-11-21 DIAGNOSIS — K5792 Diverticulitis of intestine, part unspecified, without perforation or abscess without bleeding: Secondary | ICD-10-CM | POA: Insufficient documentation

## 2019-11-21 DIAGNOSIS — R1032 Left lower quadrant pain: Secondary | ICD-10-CM | POA: Diagnosis present

## 2019-11-21 MED ORDER — IOHEXOL 300 MG/ML  SOLN
100.0000 mL | Freq: Once | INTRAMUSCULAR | Status: AC | PRN
  Administered 2019-11-21: 20:00:00 100 mL via INTRAVENOUS

## 2019-11-21 MED ORDER — METRONIDAZOLE 500 MG PO TABS: 500.0000 mg | ORAL_TABLET | Freq: Three times a day (TID) | ORAL | 0 refills | Status: DC

## 2019-11-21 MED ORDER — CIPROFLOXACIN HCL 500 MG PO TABS: 500.0000 mg | ORAL_TABLET | Freq: Two times a day (BID) | ORAL | 0 refills | Status: AC

## 2019-11-21 NOTE — Telephone Encounter (Signed)
Please see encounter today

## 2019-11-21 NOTE — Patient Instructions (Addendum)
Blood work today.   CT scan tomorrow  I'd recommend a follow-up visit with Anda Kraft on Friday to recheck and make sure you are improving.

## 2019-11-21 NOTE — Telephone Encounter (Signed)
Called to notify patient that Ct scan showed diverticulitis  which is treated with antibiotics. Recommended she keep f/u appointment Friday to make sure symptoms are improving.   Will send abx to her pharmacy.   ER precautions for worsening symptoms discussed

## 2019-11-21 NOTE — Telephone Encounter (Signed)
[facility] - Client TELEPHONE ADVICE RECORD [facility] Patient Name: LILIANE BUSK Gender: Female DOB: [birth year] Age: 44 Y 23 M 11 D Return Phone Number: [phone] (Primary) [place] [address] Client  [facility] - Client Client Site [facility] [place] - Day Physician Alma Friendly - NP Contact Type Call Who Is Calling Patient / Member / Family / Caregiver Call Type Triage / Clinical Relationship To Patient Self Return Phone Number [phone] (Primary) Chief Complaint Abdominal Pain Reason for Call Symptomatic / Request for [place] states she is experiencing nausea and abdominal pain. Translation No Nurse Assessment Nurse: Rock Nephew, RN, Juliann Pulse Date/Time (Eastern Time): 11/21/2019 3:02:45 PM Confirm and document reason for call. If symptomatic, describe symptoms. ---Caller states she is experiencing nausea and abdominal pain. Afebrile. Has the patient had close contact with a person known or suspected to have the novel coronavirus illness OR traveled / lives in area with major community spread (including international travel) in the last 14 days from the onset of symptoms? * If Asymptomatic, screen for exposure and travel within the last 14 days. ---No Does the patient have any new or worsening symptoms? ---Yes Will a triage be completed? ---Yes Related visit to physician within the last 2 weeks? ---No Does the PT have any chronic conditions? (i.e. diabetes, asthma, this includes High risk factors for pregnancy, etc.) ---No Is the patient pregnant or possibly pregnant? (Ask all females between the ages of 84-55) ---No Is this a behavioral health or substance abuse call? ---No Guidelines Guideline Title Affirmed Question Affirmed Notes Nurse Date/Time (Eastern Time) Abdominal Pain - Female [1] MILD-MODERATE pain AND [2] constant AND  [3] present > 2 hours Rock Nephew, RN, Juliann Pulse 11/21/2019 3:03:14 PM Disp. Time Eilene Ghazi Time) Disposition Final User PLEASE NOTE: All timestamps contained within this report are represented as [place] Standard Time. CONFIDENTIALTY NOTICE: This fax transmission is intended only for the addressee. It contains information that is legally privileged, confidential or otherwise protected from use or disclosure. If you are not the intended recipient, you are strictly prohibited from reviewing, disclosing, copying using or disseminating any of this information or taking any action in reliance on or regarding this information. If you have received this fax in error, please notify us immediately by telephone so that we can arrange for its return to [place]. Phone: [phone], Toll-Free: [phone], Fax: [phone] Page: 2 of 2 Call Id: [id number] 11/21/2019 3:05:33 PM See HCP within 4 Hours (or PCP triage) Yes Rock Nephew, RN, Gara Kroner Disagree/Comply Comply Caller Understands Yes PreDisposition Call Doctor Care Advice Given Per Guideline SEE HCP WITHIN 4 HOURS (OR PCP TRIAGE): * IF OFFICE WILL BE OPEN: You need to be seen within the next 3 or 4 hours. Call your doctor (or NP/PA) now or as soon as the office opens. NOTHING BY MOUTH: * Do not eat or drink anything for now. REST: * Lie down and rest. * Do this until seen. CALL BACK IF: * You become worse. CARE ADVICE given per Abdominal Pain, Female (Adult) guideline. Referrals Warm transfer to backline

## 2019-11-21 NOTE — Telephone Encounter (Signed)
Pt had appt today at 4:20 pm with Dr Einar Pheasant.

## 2019-11-21 NOTE — Progress Notes (Signed)
Subjective:     Christina [name] is a 44 y.o. female presenting for Abdominal Pain (started on 11/20/19. pain located in left abdomen area. nausea present. No fever. No vomiting. )     Abdominal Pain This is a new problem. The current episode started yesterday. The problem occurs constantly (dull constant, occasional sharp pain). The problem has been gradually worsening. The pain is located in the LLQ. The pain is at a severity of 8/10. The pain is severe. The quality of the pain is dull and sharp. The abdominal pain does not radiate. Associated symptoms include anorexia, flatus and nausea. Pertinent negatives include no arthralgias, constipation, diarrhea, fever, headaches, myalgias or vomiting. The pain is aggravated by eating. Relieved by: stretching. Treatments tried: pepto  The treatment provided no relief. Her past medical history is significant for irritable bowel syndrome.      Sexually active - one partner  Review of Systems  Constitutional: Negative for fever.  Gastrointestinal: Positive for abdominal pain, anorexia, flatus and nausea. Negative for constipation, diarrhea and vomiting.  Musculoskeletal: Negative for arthralgias and myalgias.  Neurological: Negative for headaches.     Social History   Tobacco Use  Smoking Status Never Smoker  Smokeless Tobacco Never Used        Objective:    BP Readings from Last 3 Encounters:  11/21/19 118/84  05/23/19 122/80  09/06/18 122/80   Wt Readings from Last 3 Encounters:  11/21/19 167 lb 8 oz (76 kg)  05/23/19 168 lb 8 oz (76.4 kg)  03/29/19 164 lb (74.4 kg)    BP 118/84   Pulse 63   Temp 98.2 F (36.8 C)   Wt 167 lb 8 oz (76 kg)   LMP 11/21/2019   SpO2 100%   BMI 27.04 kg/m    Physical Exam Constitutional:      General: She is not in acute distress.    Appearance: She is well-developed. She is not diaphoretic.  HENT:     Head: Normocephalic and atraumatic.     Right Ear: External ear normal.     Left  Ear: External ear normal.     Nose: Nose normal.  Eyes:     Conjunctiva/sclera: Conjunctivae normal.  Cardiovascular:     Rate and Rhythm: Normal rate and regular rhythm.     Heart sounds: No murmur.  Pulmonary:     Effort: Pulmonary effort is normal. No respiratory distress.     Breath sounds: Normal breath sounds. No wheezing.  Abdominal:     General: Abdomen is flat. Bowel sounds are normal.     Palpations: Abdomen is soft.     Tenderness: There is abdominal tenderness in the left upper quadrant and left lower quadrant. There is no right CVA tenderness, left CVA tenderness, guarding or rebound. Negative signs include Murphy's sign.  Musculoskeletal:     Cervical back: Neck supple.  Skin:    General: Skin is warm and dry.     Capillary Refill: Capillary refill takes less than 2 seconds.  Neurological:     Mental Status: She is alert. Mental status is at baseline.  Psychiatric:        Mood and Affect: Mood normal.        Behavior: Behavior normal.           Assessment & Plan:   Problem List Items Addressed This Visit      Other   Left lower quadrant abdominal pain    New onset symptoms with  intermittent severe pain and constant dull pain. DDx diverticulitis, ruptured ovarian cyst, constipation, renal stone. Well appearing overall on exam though with significant discomfort in the LLQ. Will get blood work and CT to rule out diverticulitis. F/u in 3 days or sooner prn.       Relevant Orders   CT Abdomen Pelvis W Contrast    Other Visit Diagnoses    Left lower quadrant pain    -  Primary   Relevant Orders   Comprehensive metabolic panel   CBC with Differential   High sensitivity CRP   CT Abdomen Pelvis W Contrast       Return in about 2 days (around 11/23/2019).  Lesleigh Noe, MD

## 2019-11-21 NOTE — Assessment & Plan Note (Signed)
New onset symptoms with intermittent severe pain and constant dull pain. DDx diverticulitis, ruptured ovarian cyst, constipation, renal stone. Well appearing overall on exam though with significant discomfort in the LLQ. Will get blood work and CT to rule out diverticulitis. F/u in 3 days or sooner prn.

## 2019-11-22 ENCOUNTER — Encounter: Payer: Self-pay | Admitting: Family Medicine

## 2019-11-22 LAB — CBC WITH DIFFERENTIAL/PLATELET
Basophils Absolute: 0 10*3/uL (ref 0.0–0.1)
Basophils Relative: 0.2 % (ref 0.0–3.0)
Eosinophils Absolute: 0.1 10*3/uL (ref 0.0–0.7)
Eosinophils Relative: 0.5 % (ref 0.0–5.0)
HCT: 41.5 % (ref 36.0–46.0)
Hemoglobin: 14.1 g/dL (ref 12.0–15.0)
Lymphocytes Relative: 9.7 % — ABNORMAL LOW (ref 12.0–46.0)
Lymphs Abs: 1.5 10*3/uL (ref 0.7–4.0)
MCHC: 34 g/dL (ref 30.0–36.0)
MCV: 91.2 fl (ref 78.0–100.0)
Monocytes Absolute: 1 10*3/uL (ref 0.1–1.0)
Monocytes Relative: 6.9 % (ref 3.0–12.0)
Neutro Abs: 12.6 10*3/uL — ABNORMAL HIGH (ref 1.4–7.7)
Neutrophils Relative %: 82.7 % — ABNORMAL HIGH (ref 43.0–77.0)
Platelets: 263 10*3/uL (ref 150.0–400.0)
RBC: 4.55 Mil/uL (ref 3.87–5.11)
RDW: 13.3 % (ref 11.5–15.5)
WBC: 15.2 10*3/uL — ABNORMAL HIGH (ref 4.0–10.5)

## 2019-11-22 LAB — COMPREHENSIVE METABOLIC PANEL
ALT: 11 U/L (ref 0–35)
AST: 15 U/L (ref 0–37)
Albumin: 4.3 g/dL (ref 3.5–5.2)
Alkaline Phosphatase: 41 U/L (ref 39–117)
BUN: 14 mg/dL (ref 6–23)
CO2: 27 mEq/L (ref 19–32)
Calcium: 9.2 mg/dL (ref 8.4–10.5)
Chloride: 104 mEq/L (ref 96–112)
Creatinine, Ser: 0.85 mg/dL (ref 0.40–1.20)
GFR: 72.87 mL/min (ref >60.00)
Glucose, Bld: 94 mg/dL (ref 70–99)
Potassium: 4 mEq/L (ref 3.5–5.1)
Sodium: 138 mEq/L (ref 135–145)
Total Bilirubin: 0.4 mg/dL (ref 0.2–1.2)
Total Protein: 7.2 g/dL (ref 6.0–8.3)

## 2019-11-22 LAB — HIGH SENSITIVITY CRP: CRP, High Sensitivity: 38.89 mg/L — ABNORMAL HIGH (ref 0.000–5.000)

## 2019-11-27 ENCOUNTER — Ambulatory Visit: Payer: 59 | Attending: Internal Medicine

## 2019-11-27 ENCOUNTER — Other Ambulatory Visit: Payer: Self-pay

## 2019-11-27 DIAGNOSIS — Z23 Encounter for immunization: Secondary | ICD-10-CM

## 2019-11-27 NOTE — Progress Notes (Signed)
   Covid-19 Vaccination Clinic  Name:  Christina [name]    MRN: [id number] DOB: 12-09-1975  11/27/2019  [name] was observed post Covid-19 immunization for 15 minutes without incident. She was provided with Vaccine Information Sheet and instruction to access the V-Safe system.   [name] was instructed to call 911 with any severe reactions post vaccine: Marland Kitchen Difficulty breathing  . Swelling of face and throat  . A fast heartbeat  . A bad Bagnall all over body  . Dizziness and weakness   Immunizations Administered    Name Date Dose VIS Date Route   Pfizer COVID-19 Vaccine 11/27/2019  9:39 AM 0.3 mL 08/04/2019 Intramuscular   Manufacturer: [place]   Lot: [id number]   [place]: [id number]

## 2019-11-28 ENCOUNTER — Encounter: Payer: Self-pay | Admitting: Primary Care

## 2019-11-28 ENCOUNTER — Ambulatory Visit: Payer: 59 | Admitting: Primary Care

## 2019-11-28 ENCOUNTER — Other Ambulatory Visit: Payer: Self-pay

## 2019-11-28 DIAGNOSIS — K5792 Diverticulitis of intestine, part unspecified, without perforation or abscess without bleeding: Secondary | ICD-10-CM

## 2019-11-28 NOTE — Progress Notes (Signed)
Subjective:    Patient ID: Christina [name], female    DOB: September 27, 1975, 44 y.o.   MRN: [id number]  HPI  This visit occurred during the SARS-CoV-2 public health emergency.  Safety protocols were in place, including screening questions prior to the visit, additional usage of staff PPE, and extensive cleaning of exam room while observing appropriate contact time as indicated for disinfecting solutions.   Christina [name] is a 44 year old female who presents today for follow up.  She was last evaluated on 11/21/19 by Dr. Einar Pheasant for complaints of LLQ abdominal pain. Exam was suspicious so she was sent for stat CT abdomen/pelvis and underwent labs. Labs with leukocytosis with left shift, CT scan with uncomplicated proximal descending diverticulitis. She was treated with Cipro and Flagyl courses.   Since her last visit she's compliant to her antibiotics. She's having diarrhea and nausea, was instructed to start taking a probiotic to combat symptoms, has had some improvement. She denies fevers. Her abdominal pain has nearly resolved. She is eating and drinking well.   Review of Systems  Constitutional: Negative for fever.  Gastrointestinal: Positive for abdominal pain, diarrhea and nausea. Negative for constipation and vomiting.       Past Medical History:  Diagnosis Date  . Allergy   . Diverticulitis   . Family planning   . Recurrent sinusitis      Social History   Socioeconomic History  . Marital status: Married    Spouse name: Not on file  . Number of children: 1  . Years of education: Not on file  . Highest education level: Not on file  Occupational History  . Occupation: Working for [employer] firm  Tobacco Use  . Smoking status: Never Smoker  . Smokeless tobacco: Never Used  Substance and Sexual Activity  . Alcohol use: Yes    Alcohol/week: 0.0 standard drinks    Comment: rarely  . Drug use: No  . Sexual activity: Not on file  Other Topics Concern  . Not on file  Social History  Narrative   Married.   1 child.   Works for [employer] firm in [place].   Enjoys reading, spending time with her daughter, doing martial arts.   Social Determinants of Health   Financial Resource Strain:   . Difficulty of Paying Living Expenses:   Food Insecurity:   . Worried About Charity fundraiser in the Last Year:   . Arboriculturist in the Last Year:   Transportation Needs:   . Film/video editor (Medical):   Marland Kitchen Lack of Transportation (Non-Medical):   Physical Activity:   . Days of Exercise per Week:   . Minutes of Exercise per Session:   Stress:   . Feeling of Stress :   Social Connections:   . Frequency of Communication with Friends and Family:   . Frequency of Social Gatherings with Friends and Family:   . Attends Religious Services:   . Active Member of Clubs or Organizations:   . Attends Archivist Meetings:   Marland Kitchen Marital Status:   Intimate Partner Violence:   . Fear of Current or Ex-Partner:   . Emotionally Abused:   Marland Kitchen Physically Abused:   . Sexually Abused:     Past Surgical History:  Procedure Laterality Date  . CESAREAN SECTION  01/31/2006    Family History  Problem Relation Age of Onset  . Hypertension Father   . Stroke Father   . Skin cancer Mother   .  Diabetes Paternal Grandmother   . Breast cancer Paternal Grandmother     Allergies  Allergen Reactions  . Azithromycin     REACTION: [place]  . Penicillins     [place]  . Prednisone     Toren and short of breath    Current Outpatient Medications on File Prior to Visit  Medication Sig Dispense Refill  . ciprofloxacin (CIPRO) 500 MG tablet Take 1 tablet (500 mg total) by mouth 2 (two) times daily for 7 days. 14 tablet 0  . JUNEL 1/20 1-20 MG-MCG tablet Take 1 tablet by mouth daily.  3  . loratadine (CLARITIN) 10 MG tablet Take 10 mg by mouth daily.    . metroNIDAZOLE (FLAGYL) 500 MG tablet Take 1 tablet (500 mg total) by mouth 3 (three) times daily. 21 tablet 0   No current  facility-administered medications on file prior to visit.    BP 118/82   Pulse 67   Temp (!) 96.9 F (36.1 C) (Temporal)   Ht 5\' 6"  (1.676 m)   Wt 167 lb 8 oz (76 kg)   LMP 11/21/2019 Comment: preg waiver  SpO2 98%   BMI 27.04 kg/m    Objective:   Physical Exam  Constitutional: She appears well-nourished.  Cardiovascular: Normal rate and regular rhythm.  Respiratory: Effort normal and breath sounds normal.  GI: Normal appearance and bowel sounds are normal. There is abdominal tenderness in the left lower quadrant. There is no guarding.    Musculoskeletal:     Cervical back: Neck supple.  Skin: Skin is warm and dry.           Assessment & Plan:

## 2019-11-28 NOTE — Patient Instructions (Signed)
Continue ciprofloxacin and metronidazole antibiotics as prescribed.  Advance your diet as tolerated.   It was a pleasure to see you today!   Diverticulitis  Diverticulitis is infection or inflammation of small pouches (diverticula) in the colon that form due to a condition called diverticulosis. Diverticula can trap stool (feces) and bacteria, causing infection and inflammation. Diverticulitis may cause severe stomach pain and diarrhea. It may lead to tissue damage in the colon that causes bleeding. The diverticula may also burst (rupture) and cause infected stool to enter other areas of the abdomen. Complications of diverticulitis can include:  Bleeding.  Severe infection.  Severe pain.  Rupture (perforation) of the colon.  Blockage (obstruction) of the colon. What are the causes? This condition is caused by stool becoming trapped in the diverticula, which allows bacteria to grow in the diverticula. This leads to inflammation and infection. What increases the risk? You are more likely to develop this condition if:  You have diverticulosis. The risk for diverticulosis increases if: ? You are overweight or obese. ? You use tobacco products. ? You do not get enough exercise.  You eat a diet that does not include enough fiber. High-fiber foods include fruits, vegetables, beans, nuts, and whole grains. What are the signs or symptoms? Symptoms of this condition may include:  Pain and tenderness in the abdomen. The pain is normally located on the left side of the abdomen, but it may occur in other areas.  Fever and chills.  Bloating.  Cramping.  Nausea.  Vomiting.  Changes in bowel routines.  Blood in your stool. How is this diagnosed? This condition is diagnosed based on:  Your medical history.  A physical exam.  Tests to make sure there is nothing else causing your condition. These tests may include: ? Blood tests. ? Urine tests. ? Imaging tests of the  abdomen, including X-rays, ultrasounds, MRIs, or CT scans. How is this treated? Most cases of this condition are mild and can be treated at home. Treatment may include:  Taking over-the-counter pain medicines.  Following a clear liquid diet.  Taking antibiotic medicines by mouth.  Rest. More severe cases may need to be treated at a hospital. Treatment may include:  Not eating or drinking.  Taking prescription pain medicine.  Receiving antibiotic medicines through an IV tube.  Receiving fluids and nutrition through an IV tube.  Surgery. When your condition is under control, your health care provider may recommend that you have a colonoscopy. This is an exam to look at the entire large intestine. During the exam, a lubricated, bendable tube is inserted into the anus and then passed into the rectum, colon, and other parts of the large intestine. A colonoscopy can show how severe your diverticula are and whether something else may be causing your symptoms. Follow these instructions at home: Medicines  Take over-the-counter and prescription medicines only as told by your health care provider. These include fiber supplements, probiotics, and stool softeners.  If you were prescribed an antibiotic medicine, take it as told by your health care provider. Do not stop taking the antibiotic even if you start to feel better.  Do not drive or use heavy machinery while taking prescription pain medicine. General instructions   Follow a full liquid diet or another diet as directed by your health care provider. After your symptoms improve, your health care provider may tell you to change your diet. He or she may recommend that you eat a diet that contains at least 25 g (  25 grams) of fiber daily. Fiber makes it easier to pass stool. Healthy sources of fiber include: ? Berries. One cup contains 4-8 grams of fiber. ? Beans or lentils. One half cup contains 5-8 grams of fiber. ? Green vegetables. One  cup contains 4 grams of fiber.  Exercise for at least 30 minutes, 3 times each week. You should exercise hard enough to raise your heart rate and break a sweat.  Keep all follow-up visits as told by your health care provider. This is important. You may need a colonoscopy. Contact a health care provider if:  Your pain does not improve.  You have a hard time drinking or eating food.  Your bowel movements do not return to normal. Get help right away if:  Your pain gets worse.  Your symptoms do not get better with treatment.  Your symptoms suddenly get worse.  You have a fever.  You vomit more than one time.  You have stools that are bloody, black, or tarry. Summary  Diverticulitis is infection or inflammation of small pouches (diverticula) in the colon that form due to a condition called diverticulosis. Diverticula can trap stool (feces) and bacteria, causing infection and inflammation.  You are at higher risk for this condition if you have diverticulosis and you eat a diet that does not include enough fiber.  Most cases of this condition are mild and can be treated at home. More severe cases may need to be treated at a hospital.  When your condition is under control, your health care provider may recommend that you have an exam called a colonoscopy. This exam can show how severe your diverticula are and whether something else may be causing your symptoms. This information is not intended to replace advice given to you by your health care provider. Make sure you discuss any questions you have with your health care provider. Document Revised: 07/23/2017 Document Reviewed: 09/12/2016 Elsevier Patient Education  2020 [employer].

## 2019-11-28 NOTE — Assessment & Plan Note (Signed)
To proximal descending colon. Doing well with Cipro and Flagyl courses overall. Appears well today.  Continue antibiotics. Discussed to advance diet slowly as tolerated.  Return precautions provided.

## 2019-12-19 ENCOUNTER — Ambulatory Visit: Payer: 59 | Attending: Internal Medicine

## 2019-12-19 DIAGNOSIS — Z23 Encounter for immunization: Secondary | ICD-10-CM

## 2019-12-19 NOTE — Progress Notes (Signed)
   Covid-19 Vaccination Clinic  Name:  Christina [name]    MRN: [id number] DOB: 01/31/1976  12/19/2019  [name] was observed post Covid-19 immunization for 15 minutes without incident. She was provided with Vaccine Information Sheet and instruction to access the V-Safe system.   [name] was instructed to call 911 with any severe reactions post vaccine: Marland Kitchen Difficulty breathing  . Swelling of face and throat  . A fast heartbeat  . A bad [place] all over body  . Dizziness and weakness   Immunizations Administered    Name Date Dose VIS Date Route   Pfizer COVID-19 Vaccine 12/19/2019  3:15 PM 0.3 mL 10/18/2018 Intramuscular   Manufacturer: [place]   Lot: [id number]   [place]: [id number]

## 2020-02-12 LAB — HM MAMMOGRAPHY

## 2020-02-23 ENCOUNTER — Telehealth (INDEPENDENT_AMBULATORY_CARE_PROVIDER_SITE_OTHER): Payer: 59 | Admitting: Internal Medicine

## 2020-02-23 ENCOUNTER — Encounter: Payer: Self-pay | Admitting: Internal Medicine

## 2020-02-23 DIAGNOSIS — J0101 Acute recurrent maxillary sinusitis: Secondary | ICD-10-CM | POA: Insufficient documentation

## 2020-02-23 MED ORDER — CEPHALEXIN 500 MG PO CAPS: 500.0000 mg | ORAL_CAPSULE | Freq: Three times a day (TID) | ORAL | 1 refills | Status: DC

## 2020-02-23 NOTE — Progress Notes (Signed)
Subjective:    Patient ID: Christina [name], female    DOB: 01-Jan-1976, 44 y.o.   MRN: [id number]  HPI Video virtual visit for sinus symptoms  Identification done Reviewed limitations and billing---she gave consent Participants--- patient is in conference room at work and I am in my office  Has recurrent sinus infections Has had increasing sinus headaches over the past 2 weeks Now worse despite advil sinus No fever Headaches are bilateral maxillary Some nasal drainage and PND No cough No SOB  Takes flonase and loratadine daily advil sinus as well  Current Outpatient Medications on File Prior to Visit  Medication Sig Dispense Refill  . fluticasone (FLONASE) 50 MCG/ACT nasal spray Place into both nostrils daily.    Lenda Kelp 1/20 1-20 MG-MCG tablet Take 1 tablet by mouth daily.  3  . loratadine (CLARITIN) 10 MG tablet Take 10 mg by mouth daily.     No current facility-administered medications on file prior to visit.    Allergies  Allergen Reactions  . Azithromycin     REACTION: [place]  . Penicillins     [place]  . Prednisone     [place] and short of breath    Past Medical History:  Diagnosis Date  . Allergy   . Diverticulitis   . Family planning   . Recurrent sinusitis     Past Surgical History:  Procedure Laterality Date  . CESAREAN SECTION  01/31/2006    Family History  Problem Relation Age of Onset  . Hypertension Father   . Stroke Father   . Skin cancer Mother   . Diabetes Paternal Grandmother   . Breast cancer Paternal Grandmother     Social History   Socioeconomic History  . Marital status: Married    Spouse name: Not on file  . Number of children: 1  . Years of education: Not on file  . Highest education level: Not on file  Occupational History  . Occupation: Working for [employer] firm  Tobacco Use  . Smoking status: Never Smoker  . Smokeless tobacco: Never Used  Substance and Sexual Activity  . Alcohol use: Yes    Alcohol/week: 0.0 standard drinks      Comment: rarely  . Drug use: No  . Sexual activity: Not on file  Other Topics Concern  . Not on file  Social History Narrative   Married.   1 child.   Works for [employer] firm in [place].   Enjoys reading, spending time with her daughter, doing martial arts.   Social Determinants of Health   Financial Resource Strain:   . Difficulty of Paying Living Expenses:   Food Insecurity:   . Worried About Charity fundraiser in the Last Year:   . Arboriculturist in the Last Year:   Transportation Needs:   . Film/video editor (Medical):   Marland Kitchen Lack of Transportation (Non-Medical):   Physical Activity:   . Days of Exercise per Week:   . Minutes of Exercise per Session:   Stress:   . Feeling of Stress :   Social Connections:   . Frequency of Communication with Friends and Family:   . Frequency of Social Gatherings with Friends and Family:   . Attends Religious Services:   . Active Member of Clubs or Organizations:   . Attends Archivist Meetings:   Marland Kitchen Marital Status:   Intimate Partner Violence:   . Fear of Current or Ex-Partner:   . Emotionally Abused:   .  Physically Abused:   . Sexually Abused:    Review of Systems Had COVID vaccine  No N/V Appetite is okay     Objective:   Physical Exam Constitutional:      Appearance: Normal appearance.  Pulmonary:     Effort: Pulmonary effort is normal. No respiratory distress.  Neurological:     Mental Status: She is alert.            Assessment & Plan:

## 2020-02-23 NOTE — Assessment & Plan Note (Signed)
Has had 2 weeks of increasing symptoms typical of her infections On flonase and decongestant now (and loratadine)--but not her real allergy season Suggests bacterial sinusitis Discussed using less broad Rx---- will try cephalexin and switch to omnicef only if she fails treatment

## 2020-03-15 ENCOUNTER — Encounter: Payer: Self-pay | Admitting: Primary Care

## 2020-05-02 ENCOUNTER — Encounter: Payer: Self-pay | Admitting: Family Medicine

## 2020-05-02 ENCOUNTER — Ambulatory Visit (INDEPENDENT_AMBULATORY_CARE_PROVIDER_SITE_OTHER): Payer: 59

## 2020-05-02 ENCOUNTER — Ambulatory Visit: Payer: 59 | Admitting: Family Medicine

## 2020-05-02 ENCOUNTER — Other Ambulatory Visit: Payer: Self-pay

## 2020-05-02 VITALS — BP 160/90 | HR 69 | Ht 66.0 in | Wt 172.4 lb

## 2020-05-02 DIAGNOSIS — M79674 Pain in right toe(s): Secondary | ICD-10-CM | POA: Diagnosis not present

## 2020-05-02 NOTE — Patient Instructions (Addendum)
Thank you for coming in today.  Plan for continue buddy tape. Use post op shoe as needed.  [facility] in [place] on [address].   Recheck with me in 3 weeks.   Advance activity as tolerated.    How to Buddy Tape Buddy taping refers to taping an injured finger or toe to an uninjured finger or toe that is next to it. This protects the injured finger or toe and keeps it from moving while the injury heals. You may buddy tape a finger or toe if you have a minor sprain. Your health care provider may buddy tape your finger or toe if you have a sprain, dislocation, or fracture. You may be told to replace your buddy taping as needed. What are the risks? Generally, buddy taping is safe. However, problems may occur, such as:  Skin injury or infection.  Reduced blood flow to the finger or toe.  Skin reaction to the tape. Do not buddy tape your toe if you have diabetes. Do not buddy tape if you know that you have an allergy to adhesives or surgical tape. Supplies needed:  Gauze pad, cotton, or cloth.  Tape. This may be called first-aid tape, surgical tape, or medical tape. How to buddy tape Before buddy taping Lessen any pain and swelling with rest, icing, and elevation:  Avoid activities that cause pain.  If directed, put ice on the injured area: ? Put ice in a plastic bag. ? Place a towel between your skin and the bag. ? Leave the ice on for 20 minutes, 2-3 times a day.  Raise (elevate) your hand or foot above the level of your heart while you are sitting or lying down.  Buddy taping procedure   Clean and dry your finger or toe as told by your health care provider.  Place a gauze pad or a piece of cloth or cotton between your injured finger or toe and the uninjured finger or toe.  Use tape to wrap around both fingers or toes so your injured finger or toe is secured to the uninjured finger or toe. ? The tape should be snug, but not tight. ? Make sure  the ends of the piece of tape overlap. ? Avoid placing tape directly over the joint.  Change the tape and the padding as told by your health care provider. Remove and replace the tape or padding if it becomes loose, worn, dirty, or wet. After buddy taping  Watch the buddy-taped area and always remove buddy taping if your: ? Pain gets worse. ? Fingers or toes turn pale or blue. ? Skin becomes irritated. Follow these instructions at home:  Take over-the-counter and prescription medicines only as told by your health care provider.  Return to your normal activities as told by your health care provider. Ask your health care provider what activities are safe for you. Contact a health care provider if:  You have pain, swelling, or bruising that lasts longer than 3 days.  You have a fever.  Your skin is red, cracked, or irritated. Get help right away if:  The injured area becomes cold, numb, or pale.  You have severe pain, swelling, bruising, or loss of movement in your finger or toe.  Your finger or toe changes shape (deformity). Summary  Buddy taping refers to taping an injured finger or toe to an uninjured finger or toe that is next to it.  You may buddy tape a finger or toe if you have a  minor sprain.  Take over-the-counter and prescription medicines only as told by your health care provider. This information is not intended to replace advice given to you by your health care provider. Make sure you discuss any questions you have with your health care provider. Document Revised: 12/01/2018 Document Reviewed: 08/22/2018 Elsevier Patient Education  [place].

## 2020-05-02 NOTE — Progress Notes (Signed)
    Subjective:    CC: R 5th toe  I, Molly Weber, LAT, ATC, am serving as scribe for Dr. Lynne Leader.  HPI: Pt is a 44 y/o female presenting w/ c/o R 5th toe pain after injuring her R 5th toe while doing [employer] last night.   R 5th toe swelling: yes and bruised Aggravating factors: walking, standing, weight-bearing on the R LE; pressure to the area Treatments tried: buddy taping; ice; Advil  Pertinent review of Systems: No fevers or chills  Relevant historical information: History sinusitis   Objective:    Vitals:   05/02/20 1026  BP: (!) 160/90  Pulse: 69  SpO2: 99%   General: Well Developed, well nourished, and in no acute distress.   MSK: Right foot bruised and swollen about proximal fifth phalanx.  No deformity or malalignment present.  Capillary fill and sensation are intact distally.  Lab and Radiology Results  X-ray images right foot obtained today personally and independently reviewed No definitive displaced fracture visible fifth toe.  Subtle lucency through middle phalanx likely nutrient line possibly tiny nondisplaced fracture not well visualized. Await formal radiology review    Impression and Recommendations:    Assessment and Plan: 44 y.o. female with right toe injury.  No definitive fracture on x-ray per my read today.  Radiology overread is still pending however.  Plan for buddy tape and postop shoe as needed.  Recheck in 3 weeks if needed..   Orders Placed This Encounter  Procedures  . DG Foot Complete Right    Standing Status:   Future    Number of Occurrences:   1    Standing Expiration Date:   05/02/2021    Order Specific Question:   Reason for Exam (SYMPTOM  OR DIAGNOSIS REQUIRED)    Answer:   exam poss 5th toe fx    Order Specific Question:   Is patient pregnant?    Answer:   No    Order Specific Question:   Preferred imaging location?    Answer:   [facility]    Order Specific Question:   Radiology Contrast Protocol - do  NOT remove file path    Answer:   [email or web address]   No orders of the defined types were placed in this encounter.   Discussed warning signs or symptoms. Please see discharge instructions. Patient expresses understanding.   The above documentation has been reviewed and is accurate and complete Lynne Leader, M.D.

## 2020-05-03 NOTE — Progress Notes (Signed)
X-ray did see that fracture at that small bone in the toe that we are looking at in clinic.  Continue buddy tape and postop shoe as needed.  Recheck in 3 weeks or so.

## 2020-05-23 ENCOUNTER — Other Ambulatory Visit: Payer: Self-pay

## 2020-05-23 ENCOUNTER — Ambulatory Visit (INDEPENDENT_AMBULATORY_CARE_PROVIDER_SITE_OTHER): Payer: 59

## 2020-05-23 ENCOUNTER — Ambulatory Visit (INDEPENDENT_AMBULATORY_CARE_PROVIDER_SITE_OTHER): Payer: 59 | Admitting: Family Medicine

## 2020-05-23 VITALS — Ht 66.0 in | Wt 173.0 lb

## 2020-05-23 DIAGNOSIS — S92504A Nondisplaced unspecified fracture of right lesser toe(s), initial encounter for closed fracture: Secondary | ICD-10-CM | POA: Diagnosis not present

## 2020-05-23 NOTE — Progress Notes (Signed)
   I, Wendy Poet, LAT, ATC, am serving as scribe for Dr. Lynne Leader.  Christina [name] is a 44 y.o. female who presents to [place] at [facility] today for f/u of R 5th toe fx that she sustained on 05/01/20 while doing [place] [employer].  She was advised to buddy tape her toe and wear a post-op shoe.  Since her last visit, pt reports that she has done well.  She has resumed walking and running.  She does not have much pain at all.  She plans to resume taekwondo soon.  Diagnostic testing: R foot XR- 05/02/20  Pertinent review of systems: No fevers or chills  Relevant historical information: History of diverticulitis   Exam:  Ht 5\' 6"  (1.676 m)   Wt 173 lb (78.5 kg)   BMI 27.92 kg/m  General: Well Developed, well nourished, and in no acute distress.   MSK: Right toe normal-appearing nontender normal motion.  Pulses cap refill and sensation are intact distally.    Lab and Radiology Results  X-ray images right fifth toe obtained today personally and independently interpreted No displaced fracture with special attention to the middle phalanx.  Suspect what we thought was a fracture less that was probably a nutrient line. Await radiology review     Assessment and Plan: 44 y.o. female with right fifth toe injury.  Still debatable whether or not this was a true fracture or just a nutrient line with toe contusion seen on original x-ray.  Clinically she is doing quite well.  Plan to advance activity as tolerated.  Continue buddy taping with barefoot activity such as taekwondo. Recheck as needed.   PDMP not reviewed this encounter. Orders Placed This Encounter  Procedures  . DG Toe 5th Right    Standing Status:   Future    Number of Occurrences:   1    Standing Expiration Date:   05/23/2021    Order Specific Question:   Reason for Exam (SYMPTOM  OR DIAGNOSIS REQUIRED)    Answer:   eval fx    Order Specific Question:   Is patient pregnant?    Answer:   No    Order  Specific Question:   Preferred imaging location?    Answer:   [facility]   No orders of the defined types were placed in this encounter.    Discussed warning signs or symptoms. Please see discharge instructions. Patient expresses understanding.   The above documentation has been reviewed and is accurate and complete Lynne Leader, M.D.

## 2020-05-23 NOTE — Patient Instructions (Signed)
Thank you for coming in today.  Use buddy tape as needed especially with barefoot.  Ok to advance activity as tolerated.   Recheck with me as needed for this or other orthopedic injuries.

## 2020-05-27 NOTE — Progress Notes (Signed)
Fracture not visible on x-ray.  It is possible there never was a fracture.

## 2020-10-29 IMAGING — DX DG TOE 5TH 2+V*R*
3 series · 3 of 3 positions shown · non-contrast
Comparison: 05/02/2020

CLINICAL DATA: Follow-up of fifth toe fracture.

EXAM:
RIGHT FIFTH TOE

[toe ap]
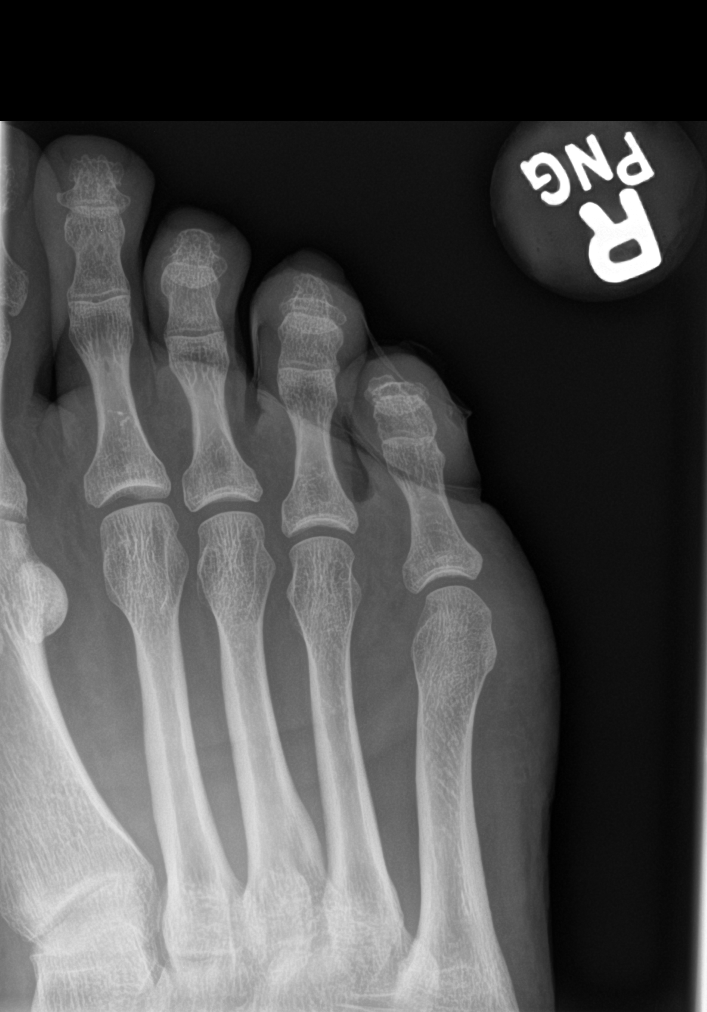

[toe obl]
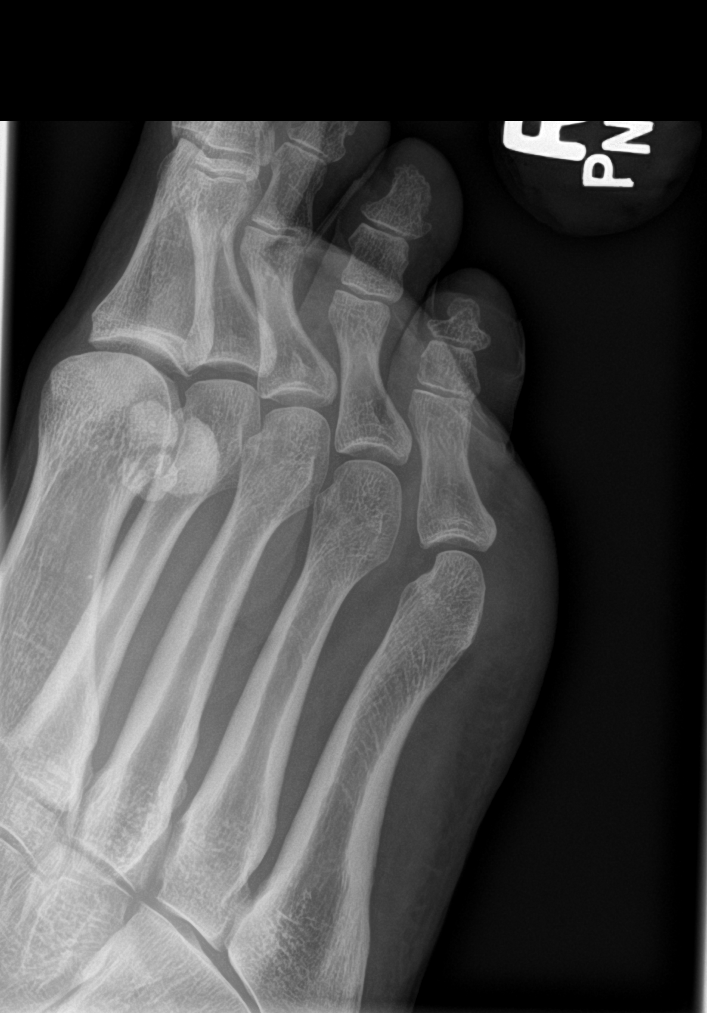

[toe lat]
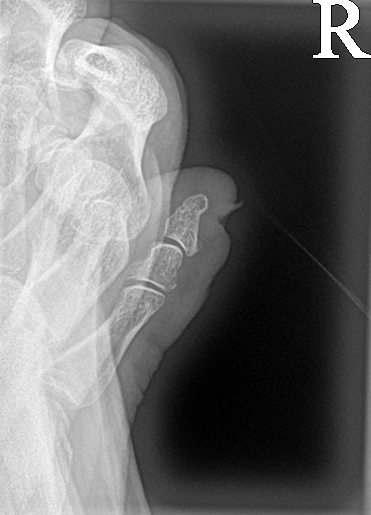

[3 of 3 positions shown; findings below may reference images not displayed]

FINDINGS: Lucency involving the middle phalanx of the fifth digit is no longer
identified. No periosteal reaction or callus deposition identified.
No new fracture.
IMPRESSION: Previously described lucency through the middle phalanx of the fifth
digit is no longer seen. No acute findings.

## 2021-05-22 DIAGNOSIS — Z01419 Encounter for gynecological examination (general) (routine) without abnormal findings: Secondary | ICD-10-CM | POA: Diagnosis not present

## 2021-05-22 DIAGNOSIS — N942 Vaginismus: Secondary | ICD-10-CM | POA: Diagnosis not present

## 2021-05-22 DIAGNOSIS — Z6828 Body mass index (BMI) 28.0-28.9, adult: Secondary | ICD-10-CM | POA: Diagnosis not present

## 2021-05-22 DIAGNOSIS — Z1231 Encounter for screening mammogram for malignant neoplasm of breast: Secondary | ICD-10-CM | POA: Diagnosis not present

## 2021-12-16 ENCOUNTER — Other Ambulatory Visit: Payer: Self-pay

## 2021-12-16 ENCOUNTER — Emergency Department (HOSPITAL_COMMUNITY)
Admission: EM | Admit: 2021-12-16 | Discharge: 2021-12-16 | Disposition: A | Discharge status: Home / Self Care | Payer: BC Managed Care – PPO | Attending: Emergency Medicine | Admitting: Emergency Medicine

## 2021-12-16 ENCOUNTER — Encounter (HOSPITAL_COMMUNITY): Payer: Self-pay

## 2021-12-16 ENCOUNTER — Telehealth: Payer: Self-pay

## 2021-12-16 ENCOUNTER — Emergency Department (HOSPITAL_BASED_OUTPATIENT_CLINIC_OR_DEPARTMENT_OTHER)
Admit: 2021-12-16 | Discharge: 2021-12-16 | Disposition: A | Discharge status: Home / Self Care | Payer: BC Managed Care – PPO

## 2021-12-16 DIAGNOSIS — M79605 Pain in left leg: Secondary | ICD-10-CM

## 2021-12-16 DIAGNOSIS — M79662 Pain in left lower leg: Secondary | ICD-10-CM | POA: Diagnosis not present

## 2021-12-16 NOTE — Progress Notes (Signed)
Left lower extremity venous duplex has been completed. ?Preliminary results can be found in CV Proc through chart review.  ?Results were given to [facility] PA. ? ?12/16/21 4:04 PM ?Carlos Levering RVT   ?

## 2021-12-16 NOTE — Discharge Instructions (Addendum)
You came to the emergency department today to be evaluated for your left leg pain.  Your ultrasound imaging did not show any signs of blood clots.  Your pain is likely musculoskeletal in nature.  Please continue to take Tylenol and ibuprofen as indicated below.  Please follow up with your primary care provider. ? ?Please take Ibuprofen (Advil, motrin) and Tylenol (acetaminophen) to relieve your pain.   ? ?You may take up to 600 MG (3 pills) of normal strength ibuprofen every 8 hours as needed.   ?You make take tylenol, up to 1,000 mg (two extra strength pills) every 8 hours as needed.  ? ?It is safe to take ibuprofen and tylenol at the same time as they work differently.  ? Do not take more than 3,000 mg tylenol in a 24 hour period (not more than one dose every 8 hours.  Please check all medication labels as many medications such as pain and cold medications may contain tylenol.  Do not drink alcohol while taking these medications.  Do not take other NSAID'S while taking ibuprofen (such as aleve or naproxen).  Please take ibuprofen with food to decrease stomach upset. ? ?Please return to the emergency department if ?Your foot becomes cold, numb, or blue. ?

## 2021-12-16 NOTE — ED Triage Notes (Signed)
Patient c/o left leg pain mainly in the left calf x 5 days. Patient states she does take birth control pills. No swelling noted at this time. ?

## 2021-12-16 NOTE — Telephone Encounter (Signed)
Agree with recommendations and plan

## 2021-12-16 NOTE — ED Notes (Signed)
I provided reinforced discharge education based off of discharge instructions. Pt acknowledged and understood my education. Pt had no further questions/concerns for provider/myself.  °

## 2021-12-16 NOTE — ED Provider Notes (Signed)
?[facility] DEPT ?Provider Note ? ? ?CSN: [id number] ?Arrival date & time: 12/16/21  1450 ? ?  ? ?History ? ?Chief Complaint  ?Patient presents with  ? Leg Pain  ? ? ?Christina [name] is a 46 y.o. female with no pertinent past medical history.  Presents emergency department for complaint of left lower leg pain.  Patient reports that pain started on Thursday and has been constant since then.  Patient's pain is located throughout her entire lower leg.  Patient has noticed intensification of pain in certain areas as her calf and upper thigh at times.  Patient denies any recent falls or traumatic injuries.  Patient reports that she does take oral contraceptive birth control. ? ?Denies any fever, chills, chest pain, shortness of breath, hemoptysis, leg swelling, color change, pallor, wound, history of DVT/PE, surgery last 4 weeks. ? ? ?Leg Pain ?Associated symptoms: no back pain, no fever and no neck pain   ? ?  ? ?Home Medications ?Prior to Admission medications   ?Medication Sig Start Date End Date Taking? Authorizing Provider  ?fluticasone (FLONASE) 50 MCG/ACT nasal spray Place into both nostrils daily.    [provider]  ?JUNEL 1/20 1-20 MG-MCG tablet Take 1 tablet by mouth daily. 11/07/17   [provider]  ?loratadine (CLARITIN) 10 MG tablet Take 10 mg by mouth daily.    [provider]  ?   ? ?Allergies    ?Azithromycin, Penicillins, and Prednisone   ? ?Review of Systems   ?Review of Systems  ?Constitutional:  Negative for chills and fever.  ?Respiratory:  Negative for shortness of breath.   ?Cardiovascular:  Negative for chest pain.  ?Gastrointestinal:  Negative for abdominal pain, nausea and vomiting.  ?Musculoskeletal:  Positive for myalgias. Negative for back pain and neck pain.  ?Skin:  Negative for color change, pallor, [place] and wound.  ?Neurological:  Negative for dizziness, syncope, light-headedness and headaches.  ?Psychiatric/Behavioral:  Negative  for confusion.   ? ?Physical Exam ?Updated Vital Signs ?BP 135/86 (BP Location: Left Arm)   Pulse 91   Temp 98.9 ?F (37.2 ?C) (Oral)   Resp 18   Ht '5\' 6"'$  (1.676 m)   SpO2 99%   BMI 27.92 kg/m?  ?Physical Exam ?Vitals and nursing note reviewed.  ?Constitutional:   ?   General: She is not in acute distress. ?   Appearance: She is not ill-appearing, toxic-appearing or diaphoretic.  ?HENT:  ?   Head: Normocephalic.  ?Eyes:  ?   General: No scleral icterus.    ?   Right eye: No discharge.     ?   Left eye: No discharge.  ?Cardiovascular:  ?   Rate and Rhythm: Normal rate.  ?   Pulses:     ?     Dorsalis pedis pulses are 2+ on the right side and 2+ on the left side.  ?Pulmonary:  ?   Effort: Pulmonary effort is normal.  ?Musculoskeletal:  ?   Left hip: No deformity, lacerations, tenderness, bony tenderness or crepitus.  ?   Left upper leg: No swelling, edema, deformity, lacerations, tenderness or bony tenderness.  ?   Left knee: No swelling, deformity, effusion, erythema, ecchymosis, lacerations, bony tenderness or crepitus. Normal range of motion. No tenderness. Normal alignment.  ?   Right lower leg: Normal.  ?   Left lower leg: Tenderness present. No swelling, deformity, lacerations or bony tenderness. No edema.  ?   Right ankle: No swelling, deformity,  ecchymosis or lacerations. No tenderness. Normal range of motion.  ?   Left ankle: No swelling, deformity, ecchymosis or lacerations. No tenderness. Normal range of motion.  ?   Right foot: Normal range of motion and normal capillary refill. No swelling, deformity, laceration, tenderness, bony tenderness or crepitus. Normal pulse.  ?   Left foot: Normal range of motion and normal capillary refill. No swelling, deformity, laceration, tenderness, bony tenderness or crepitus. Normal pulse.  ?   Comments: Tenderness to the left calf  ?Skin: ?   General: Skin is warm and dry.  ?Neurological:  ?   General: No focal deficit present.  ?   Mental Status: She is alert.   ?Psychiatric:     ?   Behavior: Behavior is cooperative.  ? ? ?ED Results / Procedures / Treatments   ?Labs ?(all labs ordered are listed, but only abnormal results are displayed) ?Labs Reviewed - No data to display ? ?EKG ?None ? ?Radiology ?No results found. ? ?Procedures ?Procedures  ? ? ?Medications Ordered in ED ?Medications - No data to display ? ?ED Course/ Medical Decision Making/ A&P ?  ?                        ?Medical Decision Making ? ?Alert 46 year old female no acute stress, nontoxic-appearing.  Presents emergency department chief complaint of left leg pain. ? ?Information obtained from patient.  Past medical records were reviewed including previous provider notes, labs, and imaging. ? ?Patient is on oral contraceptives.  Did pain throughout entire lower leg worsened calf.  Concern for possible DVT.  Will obtain ultrasound imaging at this time. ? ?I personally viewed and interpreted patient's ultrasound imaging.  Agree with radiology interpretation of no evidence of DVT. ? ?Pulse, motor, sensation intact, low sufficient for arterial occlusion at this time.  Suspect that patient's pain is musculoskeletal in nature.  Discussed symptomatic treatment over-the-counter pain medication.  Patient to follow-up with primary care provider in outpatient setting. ? ?Based on patient's chief complaint, I considered admission might be necessary, however after reassuring ED workup feel patient is reasonable for discharge.  Discussed results, findings, treatment and follow up. Patient advised of return precautions. Patient verbalized understanding and agreed with plan. ? ?Portions of this note were generated with [employer]. Dictation errors may occur despite best attempts at proofreading. ? ? ? ? ? ? ? ? ?Final Clinical Impression(s) / ED Diagnoses ?Final diagnoses:  ?None  ? ? ?Rx / DC Orders ?ED Discharge Orders   ? ? None  ? ?  ? ? ?  ?Loni Beckwith, PA-C ?12/16/21 1623 ? ?  ?Lucrezia Starch, MD ?12/17/21 [phone] ? ?

## 2021-12-16 NOTE — Telephone Encounter (Signed)
Per  chart review tab pt is at [facility] ED. Sending note to Dr Einar Pheasant who is in office. ? ?Printmaker with access nurse called and said pt has lt leg pain from the thigh to the calf. No redness or swelling  and no CP or SOB. Disposition was 4 hr outcome but no available appts at [facility] and pt will go to [facility] ED. ? ? ? ?[facility] - Client ?TELEPHONE ADVICE RECORD ?AccessNurse? ?Patient ?Name: ?Jarica R ?ASH ?Gender: Female ?DOB: [birth year] ?Age: 78 Y 5 M 6 D ?Return ?Phone ?Number: ?[phone] ?(Primary), ?[phone] ?(Secondary) ?Address: ?City/ ?State/ ?Zip: ?[place] ? [address] ?Client [facility] - Client ?Client Site [place] - Day ?Provider Alma Friendly - NP ?Contact Type Call ?Who Is Calling Patient / Member / Family / Caregiver ?Call Type Triage / Clinical ?Relationship To Patient Self ?Return Phone Number [phone] (Secondary) ?Chief Complaint Leg Pain ?Reason for Call Symptomatic / Request for Health Information ?Initial Comment Caller states they are experiencing left leg pain for ?5 or 6 days and it is getting worse but feels better ?with movement. ?Translation No ?Nurse Assessment ?Nurse: Rolin Barry, RN, Levada Dy Date/Time Eilene Ghazi Time): 12/16/2021 2:03:01 PM ?Confirm and document reason for call. If ?symptomatic, describe symptoms. ?---Caller states they are experiencing left leg pain ?for 5 or 6 days and it is getting worse but feels better ?with movement. No injury to the left leg, no falls. No ?swelling. Pain is in her thigh and calve at times. No ?temp. ?Does the patient have any new or worsening ?symptoms? ---Yes ?Will a triage be completed? ---Yes ?Related visit to physician within the last 2 weeks? ---No ?Does the PT have any chronic conditions? (i.e. ?diabetes, asthma, this includes High risk factors for ?pregnancy, etc.) ?---Yes ?List chronic conditions. ---allergy birth control ?Is the patient pregnant or  possibly pregnant? (Ask ?all females between the ages of 88-55) ---No ?Is this a behavioral health or substance abuse call? ---No ?Guidelines ?Guideline Title Affirmed Question Affirmed Notes Nurse Date/Time (Eastern ?Time) ?Leg Pain [1] Thigh or calf pain ?AND [2] only 1 side ?AND [3] present > ?1 hour (Exception: ?chronic unchanged ?pain) ?Rolin Barry, RN, Levada Dy 12/16/2021 2:04:46 ?PM ?PLEASE NOTE: All timestamps contained within this report are represented as [place] Standard Time. ?CONFIDENTIALTY NOTICE: This fax transmission is intended only for the addressee. It contains information that is legally privileged, confidential or ?otherwise protected from use or disclosure. If you are not the intended recipient, you are strictly prohibited from reviewing, disclosing, copying using ?or disseminating any of this information or taking any action in reliance on or regarding this information. If you have received this fax in error, please ?notify us immediately by telephone so that we can arrange for its return to [place]. Phone: [phone], Toll-Free: [phone], Fax: [phone] ?Page: 2 of 2 ?Call Id: [id number] ?Disp. Time (Eastern ?Time) Disposition Final User ?12/16/2021 2:07:24 PM See HCP within 4 Hours (or ?PCP triage) ?Yes Deaton, RN, Levada Dy ?Caller Disagree/Comply Comply ?Caller Understands Yes ?PreDisposition Did not know what to do ?Care Advice Given Per Guideline ?SEE HCP (OR PCP TRIAGE) WITHIN 4 HOURS: * IF OFFICE WILL BE OPEN: You need to be seen within the next 3 or 4 ?hours. Call your doctor (or NP/PA) now or as soon as the office opens. * ED: Patients who may need surgery or hospital admission ?need to be sent to an ED. So do most patients with  serious symptoms or complex medical problems. CALL EMS IF: * You develop ?any chest pain or shortness of breath. CALL BACK IF: * You become worse CARE ADVICE given per Leg Pain (Adult) guideline. ?Comments ?User: Saverio Danker, RN Date/Time Eilene Ghazi Time): 12/16/2021  2:17:13 PM ?Called the backline, spoke with Brookie Wayment, nurse, no appts available, advised to go to the ED. Caller made aware and ?said that she will go to [facility] ED. ?Referrals ?[place]

## 2022-02-12 ENCOUNTER — Telehealth: Payer: BC Managed Care – PPO | Admitting: Physician Assistant

## 2022-02-12 DIAGNOSIS — J019 Acute sinusitis, unspecified: Secondary | ICD-10-CM | POA: Diagnosis not present

## 2022-02-12 DIAGNOSIS — B9689 Other specified bacterial agents as the cause of diseases classified elsewhere: Secondary | ICD-10-CM | POA: Diagnosis not present

## 2022-02-12 MED ORDER — CEFDINIR 300 MG PO CAPS: 300.0000 mg | ORAL_CAPSULE | Freq: Two times a day (BID) | ORAL | 0 refills | Status: DC

## 2022-02-12 NOTE — Patient Instructions (Signed)
Pallavi L Sandles, thank you for joining Leeanne Rio, PA-C for today's virtual visit.  While this provider is not your primary care provider (PCP), if your PCP is located in our provider database this encounter information will be shared with them immediately following your visit.  Consent: (Patient) Christina [name] provided verbal consent for this virtual visit at the beginning of the encounter.  Current Medications:  Current Outpatient Medications:    fluticasone (FLONASE) 50 MCG/ACT nasal spray, Place into both nostrils daily., Disp: , Rfl:    JUNEL 1/20 1-20 MG-MCG tablet, Take 1 tablet by mouth daily., Disp: , Rfl: 3   loratadine (CLARITIN) 10 MG tablet, Take 10 mg by mouth daily., Disp: , Rfl:    Medications ordered in this encounter:  No orders of the defined types were placed in this encounter.    *If you need refills on other medications prior to your next appointment, please contact your pharmacy*  Follow-Up: Call back or seek an in-person evaluation if the symptoms worsen or if the condition fails to improve as anticipated.  Other Instructions Please take antibiotic as directed.  Increase fluid intake.  Use Saline nasal spray.  Take a daily multivitamin. Continue your allergy medication regimen. You can use OTC Tylenol Cold and Sinus for symptom relief as well.  Place a humidifier in the bedroom.  Please call or return clinic if symptoms are not improving.  Sinusitis Sinusitis is redness, soreness, and swelling (inflammation) of the paranasal sinuses. Paranasal sinuses are air pockets within the bones of your face (beneath the eyes, the middle of the forehead, or above the eyes). In healthy paranasal sinuses, mucus is able to drain out, and air is able to circulate through them by way of your nose. However, when your paranasal sinuses are inflamed, mucus and air can become trapped. This can allow bacteria and other germs to grow and cause infection. Sinusitis can develop  quickly and last only a short time (acute) or continue over a long period (chronic). Sinusitis that lasts for more than 12 weeks is considered chronic.  CAUSES  Causes of sinusitis include: Allergies. Structural abnormalities, such as displacement of the cartilage that separates your nostrils (deviated septum), which can decrease the air flow through your nose and sinuses and affect sinus drainage. Functional abnormalities, such as when the small hairs (cilia) that line your sinuses and help remove mucus do not work properly or are not present. SYMPTOMS  Symptoms of acute and chronic sinusitis are the same. The primary symptoms are pain and pressure around the affected sinuses. Other symptoms include: Upper toothache. Earache. Headache. Bad breath. Decreased sense of smell and taste. A cough, which worsens when you are lying flat. Fatigue. Fever. Thick drainage from your nose, which often is green and may contain pus (purulent). Swelling and warmth over the affected sinuses. DIAGNOSIS  Your caregiver will perform a physical exam. During the exam, your caregiver may: Look in your nose for signs of abnormal growths in your nostrils (nasal polyps). Tap over the affected sinus to check for signs of infection. View the inside of your sinuses (endoscopy) with a special imaging device with a light attached (endoscope), which is inserted into your sinuses. If your caregiver suspects that you have chronic sinusitis, one or more of the following tests may be recommended: Allergy tests. Nasal culture A sample of mucus is taken from your nose and sent to a lab and screened for bacteria. Nasal cytology A sample of mucus is taken  from your nose and examined by your caregiver to determine if your sinusitis is related to an allergy. TREATMENT  Most cases of acute sinusitis are related to a viral infection and will resolve on their own within 10 days. Sometimes medicines are prescribed to help relieve  symptoms (pain medicine, decongestants, nasal steroid sprays, or saline sprays).  However, for sinusitis related to a bacterial infection, your caregiver will prescribe antibiotic medicines. These are medicines that will help kill the bacteria causing the infection.  Rarely, sinusitis is caused by a fungal infection. In theses cases, your caregiver will prescribe antifungal medicine. For some cases of chronic sinusitis, surgery is needed. Generally, these are cases in which sinusitis recurs more than 3 times per year, despite other treatments. HOME CARE INSTRUCTIONS  Drink plenty of water. Water helps thin the mucus so your sinuses can drain more easily. Use a humidifier. Inhale steam 3 to 4 times a day (for example, sit in the bathroom with the shower running). Apply a warm, moist washcloth to your face 3 to 4 times a day, or as directed by your caregiver. Use saline nasal sprays to help moisten and clean your sinuses. Take over-the-counter or prescription medicines for pain, discomfort, or fever only as directed by your caregiver. SEEK IMMEDIATE MEDICAL CARE IF: You have increasing pain or severe headaches. You have nausea, vomiting, or drowsiness. You have swelling around your face. You have vision problems. You have a stiff neck. You have difficulty breathing. MAKE SURE YOU:  Understand these instructions. Will watch your condition. Will get help right away if you are not doing well or get worse. Document Released: 08/10/2005 Document Revised: 11/02/2011 Document Reviewed: 08/25/2011 [facility] Patient Information 2014 [place], [place].    If you have been instructed to have an in-person evaluation today at a local Urgent Care facility, please use the link below. It will take you to a list of all of our available [facility], including address, phone number and hours of operation. Please do not delay care.  [facility]  If you or a family member do not have a  primary care provider, use the link below to schedule a visit and establish care. When you choose a [facility] primary care physician or advanced practice provider, you gain a long-term partner in health. Find a Primary Care Provider  Learn more about [facility]'s in-office and virtual care options: [facility]

## 2022-02-12 NOTE — Progress Notes (Signed)
Virtual Visit Consent   Christina [name], you are scheduled for a virtual visit with a [facility] provider today. Just as with appointments in the office, your consent must be obtained to participate. Your consent will be active for this visit and any virtual visit you may have with one of our providers in the next 365 days. If you have a MyChart account, a copy of this consent can be sent to you electronically.  As this is a virtual visit, video technology does not allow for your provider to perform a traditional examination. This may limit your provider's ability to fully assess your condition. If your provider identifies any concerns that need to be evaluated in person or the need to arrange testing (such as labs, EKG, etc.), we will make arrangements to do so. Although advances in technology are sophisticated, we cannot ensure that it will always work on either your end or our end. If the connection with a video visit is poor, the visit may have to be switched to a telephone visit. With either a video or telephone visit, we are not always able to ensure that we have a secure connection.  By engaging in this virtual visit, you consent to the provision of healthcare and authorize for your insurance to be billed (if applicable) for the services provided during this visit. Depending on your insurance coverage, you may receive a charge related to this service.  I need to obtain your verbal consent now. Are you willing to proceed with your visit today? Christina [name] has provided verbal consent on 02/12/2022 for a virtual visit (video or telephone). Leeanne Rio, [place]  Date: 02/12/2022 11:03 AM  Virtual Visit via Video Note   I, Leeanne Rio, connected with  Christina [name]  ([id number], [birth year]) on 02/12/22 at 11:00 AM EDT by a video-enabled telemedicine application and verified that I am speaking with the correct person using two identifiers.  Location: Patient: Virtual Visit  Location Patient: Home Provider: Virtual Visit Location Provider: Home Office   I discussed the limitations of evaluation and management by telemedicine and the availability of in person appointments. The patient expressed understanding and agreed to proceed.    History of Present Illness: Christina [name] is a 46 y.o. who identifies as a female who was assigned female at birth, and is being seen today for possible sinus infection. Has noted 6 days of sinus congestion, sinus pressure, sore throat. Has also noted some left maxillary pain/tooth pain. Denies chest congestion, cough or SOB. Denies recent travel or sick contact. Has history of sinusitis but thankfully has not had an episode in a while since taking a daily allergy medication regimen.   HPI: HPI  Problems:  Patient Active Problem List   Diagnosis Date Noted   Acute recurrent maxillary sinusitis 02/23/2020   Acute diverticulitis 11/21/2019    Allergies:  Allergies  Allergen Reactions   Azithromycin     REACTION: [place]   Penicillins     Sinor   Prednisone     [place] and short of breath   Medications:  Current Outpatient Medications:    fluticasone (FLONASE) 50 MCG/ACT nasal spray, Place into both nostrils daily., Disp: , Rfl:    JUNEL 1/20 1-20 MG-MCG tablet, Take 1 tablet by mouth daily., Disp: , Rfl: 3   loratadine (CLARITIN) 10 MG tablet, Take 10 mg by mouth daily., Disp: , Rfl:   Observations/Objective: Patient is well-developed, well-nourished in no acute distress.  Resting comfortably  at home.  Head is normocephalic, atraumatic.  No labored breathing. Speech is clear and coherent with logical content.  Patient is alert and oriented at baseline.   Assessment and Plan: 1. Acute bacterial sinusitis  Rx Cefdinir 300 mg BID x 10 days. She is penicillin-allergic but tolerates cephalosporins. She is also leaving for a cruise so hesitant to give her Doxy due to suns-sensitivity or Bactrim as she has never taken before.  Increase fluids.  Rest.  Saline nasal spray.  Probiotic.  Mucinex as directed.  Humidifier in bedroom. Resume Flonase and Claritin.  Call or return to clinic if symptoms are not improving.   Follow Up Instructions: I discussed the assessment and treatment plan with the patient. The patient was provided an opportunity to ask questions and all were answered. The patient agreed with the plan and demonstrated an understanding of the instructions.  A copy of instructions were sent to the patient via MyChart unless otherwise noted below.   The patient was advised to call back or seek an in-person evaluation if the symptoms worsen or if the condition fails to improve as anticipated.  Time:  I spent 10 minutes with the patient via telehealth technology discussing the above problems/concerns.    Leeanne Rio, PA-C

## 2022-03-27 ENCOUNTER — Ambulatory Visit (INDEPENDENT_AMBULATORY_CARE_PROVIDER_SITE_OTHER)
Admission: RE | Admit: 2022-03-27 | Discharge: 2022-03-27 | Disposition: A | Discharge status: Home / Self Care | Payer: BC Managed Care – PPO | Source: Ambulatory Visit | Attending: Nurse Practitioner | Admitting: Nurse Practitioner

## 2022-03-27 ENCOUNTER — Ambulatory Visit: Payer: BC Managed Care – PPO | Admitting: Nurse Practitioner

## 2022-03-27 VITALS — BP 132/88 | HR 67 | Temp 97.0°F | Resp 10 | Ht 66.0 in | Wt 193.0 lb

## 2022-03-27 DIAGNOSIS — M79672 Pain in left foot: Secondary | ICD-10-CM

## 2022-03-27 MED ORDER — IBUPROFEN 400 MG PO TABS: 400.0000 mg | ORAL_TABLET | Freq: Three times a day (TID) | ORAL | 0 refills | Status: AC

## 2022-03-27 NOTE — Patient Instructions (Signed)
Nice to see you today I sent in ibuprofen. Take it regularly over the next week at least I will be in touch with the xray results once I have them Follow up if no improvement

## 2022-03-27 NOTE — Assessment & Plan Note (Signed)
No discrete injury.  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  Getting ready to go on a trip to [place] once with a lot of walking.  We will obtain picture of left foot in office today.  Also write ibuprofen 400 mg 3 times daily for 10 days patient to take it regularly for the next 7 with food.  Follow-up if no improvement

## 2022-03-27 NOTE — Progress Notes (Signed)
Acute Office Visit  Subjective:     Patient ID: Christina [name], female    DOB: 05/03/1976, 46 y.o.   MRN: [id number]  Chief Complaint  Patient presents with   Foot Pain    Left foot pain, started about a week ago, hurts on the side and top of the foot, off and on near the heel area. Had an issue with pain close to this in April but it was more in the left leg and DVT was ruled out as negative. It got better at that time. No redness or swelling noticed.    Foot Pain Pertinent negatives include no chills or fever.   Patient is in today for foot pain  States that it started approx 1 week ago. States that she had completed taekwondo. States no injury that she is aware. States that standing and walking make it worse . Described as a continuous dull ache.  She has been using an ankle support and ibuprofen with some relief.  Took '400mg'$  a few hours ago  States she mainly wears well supported shoes like running shoes.  Occasionally wear open toed flip-flops and a rare occasion she will use heels for her profession.    Review of Systems  Constitutional:  Negative for chills and fever.  Musculoskeletal:  Positive for joint pain.  Neurological:  Negative for tingling.        Objective:    BP 132/88   Pulse 67   Temp (!) 97 F (36.1 C)   Resp 10   Ht '5\' 6"'$  (1.676 m)   Wt 193 lb (87.5 kg)   LMP 02/24/2022   SpO2 98%   BMI 31.15 kg/m    Physical Exam Vitals and nursing note reviewed.  Constitutional:      Appearance: Normal appearance.  Cardiovascular:     Rate and Rhythm: Normal rate and regular rhythm.     Pulses:          Dorsalis pedis pulses are 2+ on the right side and 2+ on the left side.       Posterior tibial pulses are 2+ on the right side and 2+ on the left side.     Heart sounds: Normal heart sounds.  Pulmonary:     Effort: Pulmonary effort is normal.     Breath sounds: Normal breath sounds.  Musculoskeletal:        General: Tenderness present. No signs  of injury.     Right lower leg: No edema.     Left lower leg: No edema.       Legs:     Comments: No appreciable erythema edema or ecchymosis.  Bilateral calf's non tender to palpation   Neurological:     Mental Status: She is alert.     No results found for any visits on 03/27/22.      Assessment & Plan:   Problem List Items Addressed This Visit       Other   Left foot pain - Primary    No discrete injury.  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  Getting ready to go on a trip to [place] once with a lot of walking.  We will obtain picture of left foot in office today.  Also write ibuprofen 400 mg 3 times daily for 10 days patient to take it regularly for the next 7 with food.  Follow-up if no improvement  Relevant Medications   ibuprofen (ADVIL) 400 MG tablet   Other Relevant Orders   DG Foot Complete Left    Meds ordered this encounter  Medications   ibuprofen (ADVIL) 400 MG tablet    Sig: Take 1 tablet (400 mg total) by mouth 3 (three) times daily for 10 days.    Dispense:  30 tablet    Refill:  0    Order Specific Question:   Supervising Provider    Answer:   TOWER, MARNE A [[id number]    Return if symptoms worsen or fail to improve.  Romilda Garret, NP

## 2022-06-25 DIAGNOSIS — Z124 Encounter for screening for malignant neoplasm of cervix: Secondary | ICD-10-CM | POA: Diagnosis not present

## 2022-06-25 DIAGNOSIS — Z01419 Encounter for gynecological examination (general) (routine) without abnormal findings: Secondary | ICD-10-CM | POA: Diagnosis not present

## 2022-06-25 DIAGNOSIS — N941 Unspecified dyspareunia: Secondary | ICD-10-CM | POA: Diagnosis not present

## 2022-06-25 DIAGNOSIS — Z1231 Encounter for screening mammogram for malignant neoplasm of breast: Secondary | ICD-10-CM | POA: Diagnosis not present

## 2022-06-25 DIAGNOSIS — Z6832 Body mass index (BMI) 32.0-32.9, adult: Secondary | ICD-10-CM | POA: Diagnosis not present

## 2022-06-25 LAB — HM MAMMOGRAPHY

## 2022-06-29 ENCOUNTER — Encounter: Payer: Self-pay | Admitting: Primary Care

## 2022-11-02 ENCOUNTER — Encounter: Payer: Self-pay | Admitting: Family Medicine

## 2022-11-02 ENCOUNTER — Ambulatory Visit: Payer: BC Managed Care – PPO | Admitting: Family Medicine

## 2022-11-02 VITALS — BP 130/84 | HR 80 | Temp 98.0°F | Resp 20 | Ht 66.0 in | Wt 194.0 lb

## 2022-11-02 DIAGNOSIS — R42 Dizziness and giddiness: Secondary | ICD-10-CM

## 2022-11-02 DIAGNOSIS — G44319 Acute post-traumatic headache, not intractable: Secondary | ICD-10-CM | POA: Diagnosis not present

## 2022-11-02 DIAGNOSIS — S0990XA Unspecified injury of head, initial encounter: Secondary | ICD-10-CM | POA: Diagnosis not present

## 2022-11-02 NOTE — Progress Notes (Signed)
Assessment & Plan:  1-3. Traumatic injury of head, initial encounter/Dizziness/Acute post-traumatic headache, not intractable STAT Head CT. Education provided on concussions.  Discussed limiting screen time.  Tylenol/ibuprofen for pain.  Encouraged rest to help the brain heal. - CT HEAD WO CONTRAST (5MM); Future   Follow up plan: Return if symptoms worsen or fail to improve.  Hendricks Limes, MSN, APRN, FNP-C  Subjective:  HPI: Christina [name] is a 47 y.o. female presenting on 11/02/2022 for Head Injury (A large plastic tote fell off a top shelve and hit her in the head on Friday - experiencing dizzy spells since then )  Patient reports a large plastic tote fell off the top shelf and hit her in the head three days ago. That day she started experiencing dizziness with walking/standing and had a headache. Yesterday the dizziness worsened and she was dizzy just sitting. She denies loss of consciousness, nausea, vomiting, and seizures.  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  Due to the worsening dizziness, she felt she needed to get checked out.   ROS: Negative unless specifically indicated above in HPI.   Relevant past medical history reviewed and updated as indicated.   Allergies and medications reviewed and updated.   Current Outpatient Medications:    fluticasone (FLONASE) 50 MCG/ACT nasal spray, Place into both nostrils daily., Disp: , Rfl:    JUNEL 1/20 1-20 MG-MCG tablet, Take 1 tablet by mouth daily., Disp: , Rfl: 3   loratadine (CLARITIN) 10 MG tablet, Take 10 mg by mouth daily., Disp: , Rfl:   Allergies  Allergen Reactions   Azithromycin     REACTION: [place]   Penicillins     Depinto   Prednisone     [place] and short of breath    Objective:   BP 130/84   Pulse 80   Temp 98 F (36.7 C)   Resp 20   Ht '5\' 6"'$  (1.676 m)   Wt 194 lb (88 kg)   LMP 08/07/2022 (Approximate)   BMI 31.31 kg/m    Physical Exam Vitals reviewed.   Constitutional:      General: She is not in acute distress.    Appearance: Normal appearance. She is not ill-appearing, toxic-appearing or diaphoretic.  HENT:     Head: Normocephalic and atraumatic.  Eyes:     General: No scleral icterus.       Right eye: No discharge.        Left eye: No discharge.     Extraocular Movements: Extraocular movements intact.     Conjunctiva/sclera: Conjunctivae normal.  Cardiovascular:     Rate and Rhythm: Normal rate.  Pulmonary:     Effort: Pulmonary effort is normal. No respiratory distress.  Musculoskeletal:        General: Normal range of motion.     Cervical back: Normal range of motion.  Skin:    General: Skin is warm and dry.     Capillary Refill: Capillary refill takes less than 2 seconds.  Neurological:     General: No focal deficit present.     Mental Status: She is alert and oriented to person, place, and time. Mental status is at baseline.     Motor: Motor function is intact. No weakness, abnormal muscle tone, seizure activity or pronator drift.     Coordination: Coordination is intact. Romberg sign negative. Coordination normal. Finger-Nose-Finger Test and Heel to [facility] Test normal.  Gait: Gait normal.  Psychiatric:        Mood and Affect: Mood normal.        Behavior: Behavior normal.        Thought Content: Thought content normal.        Judgment: Judgment normal.

## 2022-11-02 NOTE — Addendum Note (Signed)
Addended by: Zannie Cove on: 11/02/2022 02:53 PM   Modules accepted: Orders

## 2022-11-03 ENCOUNTER — Ambulatory Visit
Admission: RE | Admit: 2022-11-03 | Discharge: 2022-11-03 | Disposition: A | Discharge status: Home / Self Care | Payer: BC Managed Care – PPO | Source: Ambulatory Visit | Attending: Family Medicine | Admitting: Family Medicine

## 2022-11-03 DIAGNOSIS — R519 Headache, unspecified: Secondary | ICD-10-CM | POA: Diagnosis not present

## 2022-11-03 DIAGNOSIS — R42 Dizziness and giddiness: Secondary | ICD-10-CM

## 2022-11-03 DIAGNOSIS — G44319 Acute post-traumatic headache, not intractable: Secondary | ICD-10-CM

## 2022-11-03 DIAGNOSIS — S0990XA Unspecified injury of head, initial encounter: Secondary | ICD-10-CM | POA: Diagnosis not present

## 2023-04-13 NOTE — Progress Notes (Unsigned)
    Christina Collister T. Dola Lunsford, MD, CAQ Sports Medicine [facility] [address] [place], [address]  Phone: [phone]  FAX: [phone]  Christina [name] - 47 y.o. female  MRN [id number]  Date of Birth: 01/22/1976  Date: 04/14/2023  PCP: Doreene Nest, NP  Referral: Doreene Nest, NP  No chief complaint on file.  Subjective:   Christina [name] is a 47 y.o. very pleasant female patient with There is no height or weight on file to calculate BMI. who presents with the following:  Very pleasant 47 year old patient who is new to me who presents with some ongoing left-sided foot pain.    Review of Systems is noted in the HPI, as appropriate  Objective:   There were no vitals taken for this visit.  GEN: No acute distress; alert,appropriate. PULM: Breathing comfortably in no respiratory distress PSYCH: Normally interactive.   Laboratory and Imaging Data:  Assessment and Plan:   ***

## 2023-04-14 ENCOUNTER — Encounter: Payer: Self-pay | Admitting: Family Medicine

## 2023-04-14 ENCOUNTER — Ambulatory Visit: Payer: BC Managed Care – PPO | Admitting: Family Medicine

## 2023-04-14 VITALS — BP 120/90 | HR 75 | Temp 97.4°F | Ht 66.0 in | Wt 195.4 lb

## 2023-04-14 DIAGNOSIS — M79672 Pain in left foot: Secondary | ICD-10-CM

## 2023-04-14 DIAGNOSIS — M722 Plantar fascial fibromatosis: Secondary | ICD-10-CM

## 2023-04-14 NOTE — Patient Instructions (Signed)
Please read handouts on Plantar Fascitis.  STRETCHING and Strengthening program critically important.  Strengthening on foot and calf muscles as seen in handout. Calf raises, 2 legged, then 1 legged. Foot massage with tennis ball. Ice massage.  Towel Scrunches: get a towel or hand towel, use toes to pick up and scrunch up the towel.  Marble pick-ups, practice picking up marbles with toes and placing into a cup  NEEDS TO BE DONE EVERY DAY  Recommended over the counter insoles. (Spenco or Hapad)  A rigid shoe with good arch support helps: Dansko (great), Keen, Merrell No easily bendable shoes.   Tuli's heel cups  

## 2023-05-26 ENCOUNTER — Telehealth: Payer: Self-pay | Admitting: Primary Care

## 2023-05-26 NOTE — Telephone Encounter (Signed)
FYI: This call has been transferred to Access Nurse. Once the result note has been entered staff can address the message at that time.  Patient called in with the following symptoms:  Red Word: chest tightness, sob    Please advise at Mobile [phone] (mobile)  Message is routed to Provider Pool and [facility] Triage    Pt called in stating she's experiencing some chest tightness & sob. No other symptoms. Scheduled pt with [facility] for tomorrow, 10/3 @ 10:20am. Transferred pt to access nurse.

## 2023-05-26 NOTE — Telephone Encounter (Signed)
Pt already has appt with Allayne Gitelman NP on 05/27/23 at 10:20.see comments end of access nurse note. Sending note to Allayne Gitelman NP and [facility] pool.

## 2023-05-26 NOTE — Telephone Encounter (Signed)
Noted, will evaluate. 

## 2023-05-27 ENCOUNTER — Ambulatory Visit: Payer: BC Managed Care – PPO | Admitting: Primary Care

## 2023-05-27 ENCOUNTER — Encounter: Payer: Self-pay | Admitting: Primary Care

## 2023-05-27 VITALS — BP 132/84 | HR 76 | Temp 97.6°F | Ht 66.0 in | Wt 200.0 lb

## 2023-05-27 DIAGNOSIS — R079 Chest pain, unspecified: Secondary | ICD-10-CM | POA: Diagnosis not present

## 2023-05-27 LAB — BASIC METABOLIC PANEL
BUN: 13 mg/dL (ref 6–23)
CO2: 26 meq/L (ref 19–32)
Calcium: 9.6 mg/dL (ref 8.4–10.5)
Chloride: 105 meq/L (ref 96–112)
Creatinine, Ser: 0.76 mg/dL (ref 0.40–1.20)
GFR: 93.67 mL/min (ref >60.00)
Glucose, Bld: 77 mg/dL (ref 70–99)
Potassium: 4.2 meq/L (ref 3.5–5.1)
Sodium: 139 meq/L (ref 135–145)

## 2023-05-27 LAB — CBC
HCT: 44 % (ref 36.0–46.0)
Hemoglobin: 14.7 g/dL (ref 12.0–15.0)
MCHC: 33.3 g/dL (ref 30.0–36.0)
MCV: 90.6 fL (ref 78.0–100.0)
Platelets: 303 10*3/uL (ref 150.0–400.0)
RBC: 4.86 Mil/uL (ref 3.87–5.11)
RDW: 13.2 % (ref 11.5–15.5)
WBC: 9 10*3/uL (ref 4.0–10.5)

## 2023-05-27 LAB — TSH: TSH: 4.09 u[IU]/mL (ref 0.35–5.50)

## 2023-05-27 LAB — LIPID PANEL
Cholesterol: 179 mg/dL (ref 0–200)
HDL: 62.3 mg/dL (ref >39.00)
LDL Cholesterol: 92 mg/dL (ref 0–99)
NonHDL: 116.42
Total CHOL/HDL Ratio: 3
Triglycerides: 121 mg/dL (ref 0.0–149.0)
VLDL: 24.2 mg/dL (ref 0.0–40.0)

## 2023-05-27 MED ORDER — OMEPRAZOLE 20 MG PO CPDR: 20.0000 mg | DELAYED_RELEASE_CAPSULE | Freq: Every day | ORAL | 0 refills | Status: DC

## 2023-05-27 NOTE — Assessment & Plan Note (Addendum)
Differentials include GERD, CAD. Less likely pneumonia or pulmonary embolism.  EKG today with normal sinus rhythm, rate of 71.  No acute ST changes, PAC/PVCs.  No old EKG to compare.  Checking labs today including D-dimer, lipids, BMP, CBC.  Start omeprazole 20 mg daily before meal.  Consider cardiology referral if no improvement in symptoms.

## 2023-05-27 NOTE — Patient Instructions (Signed)
Stop by the lab prior to leaving today. I will notify you of your results once received.   Start omeprazole 20 mg daily with a meal for chest pain.  It was a pleasure to see you today!

## 2023-05-27 NOTE — Progress Notes (Signed)
Subjective:    Patient ID: Christina [name], female    DOB: [birth year], 47 y.o.   MRN: [id number]  Chest Pain  Pertinent negatives include no abdominal pain, fever, nausea, shortness of breath or vomiting.    Christina [name] is a very pleasant 47 y.o. female with a history of acute diverticulitis who presents today   She contacted our office yesterday with symptoms of intermittent chest tightness, shortness of breath.  She was scheduled for further evaluation.  Her pain began three days ago to the right upper chest that lasted for about 30 minutes. Yesterday morning she woke up from sleep, felt significant chest pressure to the mid upper chest for about 5-10 minutes, the pain eased up but has continued since. Yesterday she noticed bilateral lower rib pain/epigastric pain. Today her pain is located to the mid upper chest which is dull.   Her pain feels better when sitting up or when laying with her head propped up. She's not taken anything OTC for symptoms.   She has noticed some recent sinus pressure, mild cough last week but is feeling better. She denies esophageal burning, nausea, diaphoresis, short of breath, recent long travel, family history of heart attack/heart disease, exertional chest pain/shortness of breath. Prior to symptom onset she had [place] food for dinner. She is managed on OCPs.  BP Readings from Last 3 Encounters:  05/27/23 132/84  04/14/23 (!) 120/90  11/02/22 130/84      Review of Systems  Constitutional:  Negative for chills and fever.  HENT:  Negative for postnasal drip.   Respiratory:  Positive for chest tightness. Negative for shortness of breath.   Cardiovascular:  Positive for chest pain.  Gastrointestinal:  Negative for abdominal pain, constipation, diarrhea, nausea and vomiting.         Past Medical History:  Diagnosis Date   Allergy    Diverticulitis    Family planning    Recurrent sinusitis     Social History   Socioeconomic History    Marital status: Married    Spouse name: Not on file   Number of children: 1   Years of education: Not on file   Highest education level: Not on file  Occupational History   Occupation: Working for [employer] firm  Tobacco Use   Smoking status: Never   Smokeless tobacco: Never  Vaping Use   Vaping status: Never Used  Substance and Sexual Activity   Alcohol use: Yes    Alcohol/week: 0.0 standard drinks of alcohol    Comment: rarely   Drug use: No   Sexual activity: Not on file  Other Topics Concern   Not on file  Social History Narrative   Married.   1 child.   Works for [employer] firm in [place].   Enjoys reading, spending time with her daughter, doing martial arts.   Social Determinants of Health   Financial Resource Strain: Not on file  Food Insecurity: Not on file  Transportation Needs: Not on file  Physical Activity: Not on file  Stress: Not on file  Social Connections: Not on file  Intimate Partner Violence: Not on file    Past Surgical History:  Procedure Laterality Date   CESAREAN SECTION  01/31/2006    Family History  Problem Relation Age of Onset   Skin cancer Mother    Hypertension Father    Stroke Father    Diabetes Paternal Grandmother    Breast cancer Paternal Grandmother     Allergies  Allergen  Reactions   Azithromycin     REACTION: [place]   Penicillins     Seres   Prednisone     [place] and short of breath    Current Outpatient Medications on File Prior to Visit  Medication Sig Dispense Refill   fluticasone (FLONASE) 50 MCG/ACT nasal spray Place into both nostrils daily.     loratadine (CLARITIN) 10 MG tablet Take 10 mg by mouth daily.     SLYND 4 MG TABS Take 1 tablet by mouth daily.     No current facility-administered medications on file prior to visit.    BP 132/84   Pulse 76   Temp 97.6 F (36.4 C) (Temporal)   Ht 5\' 6"  (1.676 m)   Wt 200 lb (90.7 kg)   SpO2 98%   BMI 32.28 kg/m  Objective:   Physical Exam Cardiovascular:     Rate and  Rhythm: Normal rate and regular rhythm.  Pulmonary:     Effort: Pulmonary effort is normal.     Breath sounds: Normal breath sounds.  Musculoskeletal:     Cervical back: Neck supple.  Skin:    General: Skin is warm and dry.  Neurological:     Mental Status: She is alert and oriented to person, place, and time.  Psychiatric:        Mood and Affect: Mood normal.           Assessment & Plan:  Chest pain, unspecified type Assessment & Plan: Differentials include GERD, CAD. Less likely pneumonia or pulmonary embolism.  EKG today with normal sinus rhythm, rate of 71.  No acute ST changes, PAC/PVCs.  No old EKG to compare.  Checking labs today including D-dimer, lipids, BMP, CBC.  Start omeprazole 20 mg daily before meal.  Consider cardiology referral if no improvement in symptoms.  Orders: -     EKG 12-Lead -     CBC -     Basic metabolic panel -     Lipid panel -     D-dimer, quantitative -     Omeprazole; Take 1 capsule (20 mg total) by mouth daily.  Dispense: 90 capsule; Refill: 0 -     TSH        Doreene Nest, NP

## 2023-05-28 LAB — D-DIMER, QUANTITATIVE: D-Dimer, Quant: 0.22 ug{FEU}/mL (ref <0.50)

## 2023-07-19 ENCOUNTER — Telehealth: Payer: BC Managed Care – PPO

## 2023-07-19 DIAGNOSIS — B9689 Other specified bacterial agents as the cause of diseases classified elsewhere: Secondary | ICD-10-CM

## 2023-07-19 DIAGNOSIS — J019 Acute sinusitis, unspecified: Secondary | ICD-10-CM

## 2023-07-20 MED ORDER — DOXYCYCLINE HYCLATE 100 MG PO TABS: 100.0000 mg | ORAL_TABLET | Freq: Two times a day (BID) | ORAL | 0 refills | Status: DC

## 2023-07-20 NOTE — Progress Notes (Signed)

## 2023-07-20 NOTE — Progress Notes (Signed)
I have spent 5 minutes in review of e-visit questionnaire, review and updating patient chart, medical decision making and response to patient.   Mia Milan Cody Jacklynn Dehaas, PA-C    

## 2023-11-01 LAB — HM MAMMOGRAPHY

## 2023-11-29 ENCOUNTER — Telehealth: Admitting: Physician Assistant

## 2023-11-29 DIAGNOSIS — B9689 Other specified bacterial agents as the cause of diseases classified elsewhere: Secondary | ICD-10-CM | POA: Diagnosis not present

## 2023-11-29 DIAGNOSIS — J208 Acute bronchitis due to other specified organisms: Secondary | ICD-10-CM | POA: Diagnosis not present

## 2023-11-29 MED ORDER — DOXYCYCLINE HYCLATE 100 MG PO TABS: 100.0000 mg | ORAL_TABLET | Freq: Two times a day (BID) | ORAL | 0 refills | Status: DC

## 2023-11-29 MED ORDER — BENZONATATE 100 MG PO CAPS: 100.0000 mg | ORAL_CAPSULE | Freq: Three times a day (TID) | ORAL | 0 refills | Status: DC | PRN

## 2023-11-29 NOTE — Progress Notes (Signed)
 E-Visit for Cough   We are sorry that you are not feeling well.  Here is how we plan to help!  Based on your presentation I believe you most likely have A cough due to bacteria.  When patients have a fever and a productive cough with a change in color or increased sputum production, we are concerned about bacterial bronchitis.  If left untreated it can progress to pneumonia.  If your symptoms do not improve with your treatment plan it is important that you contact your provider.   I have prescribed Doxycycline 100 mg twice a day for 7 days     In addition you may use A non-prescription cough medication called Mucinex DM: take 2 tablets every 12 hours. and A prescription cough medication called Tessalon Perles 100mg . You may take 1-2 capsules every 8 hours as needed for your cough.  From your responses in the eVisit questionnaire you describe inflammation in the upper respiratory tract which is causing a significant cough.  This is commonly called Bronchitis and has four common causes:   Allergies Viral Infections Acid Reflux Bacterial Infection Allergies, viruses and acid reflux are treated by controlling symptoms or eliminating the cause. An example might be a cough caused by taking certain blood pressure medications. You stop the cough by changing the medication. Another example might be a cough caused by acid reflux. Controlling the reflux helps control the cough.     HOME CARE Only take medications as instructed by your medical team. Complete the entire course of an antibiotic. Drink plenty of fluids and get plenty of rest. Avoid close contacts especially the very young and the elderly Cover your mouth if you cough or cough into your sleeve. Always remember to wash your hands A steam or ultrasonic humidifier can help congestion.   GET HELP RIGHT AWAY IF: You develop worsening fever. You become short of breath You cough up blood. Your symptoms persist after you have completed your  treatment plan MAKE SURE YOU  Understand these instructions. Will watch your condition. Will get help right away if you are not doing well or get worse.    Thank you for choosing an e-visit.  Your e-visit answers were reviewed by a board certified advanced clinical practitioner to complete your personal care plan. Depending upon the condition, your plan could have included both over the counter or prescription medications.  Please review your pharmacy choice. Make sure the pharmacy is open so you can pick up prescription now. If there is a problem, you may contact your provider through [employer] and have the prescription routed to another pharmacy.  Your safety is important to [place]. If you have drug allergies check your prescription carefully.   For the next 24 hours you can use MyChart to ask questions about today's visit, request a non-urgent call back, or ask for a work or school excuse. You will get an email in the next two days asking about your experience. I hope that your e-visit has been valuable and will speed your recovery.   I have spent 5 minutes in review of e-visit questionnaire, review and updating patient chart, medical decision making and response to patient.   Margaretann Loveless, PA-C

## 2023-12-13 ENCOUNTER — Ambulatory Visit: Payer: Self-pay

## 2023-12-13 NOTE — Telephone Encounter (Signed)
 Noted will evaluate in office tomorrow

## 2023-12-13 NOTE — Telephone Encounter (Signed)
  Chief Complaint: Hand Burn Symptoms: blisters to the palm of left hand where the thumb meets the hand Frequency: occurred December 04, 2023 Pertinent Negatives: Patient denies CP, SOB, fever Disposition: [] ED /[] Urgent Care (no appt availability in office) / [x] Appointment(In office/virtual)/ []  [facility]/ [] Home Care/ [] Refused Recommended Disposition /[] [facility]/ []  Follow-up with PCP Additional Notes: patient called with concerns of burn to left hand. Patient states burn happened on December 04, 2023 when patient was pulling a pan out of the oven. Patient states burn is nickel size in involves part of the palm of hand where the thumb meets the palm. Patient states she cleaned it and placed burn cream on the area. Patient is concerned that the burn isn't healing well. Patient recommended to be seen within three days. Patient scheduled for an appointment tomorrow with PCP office for 11:00 AM. Patient verbalized understanding of plan and all questions answered.     Copied from CRM [id number]. Topic: Clinical - Red Word Triage >> Dec 13, 2023 11:27 AM Freya Jesus wrote: Red Word that prompted transfer to Nurse Triage: Burned hand a week ago and want to get it looked at. Pain to touch. Reason for Disposition  [1] Minor thermal burn AND [2] last tetanus shot > 5 years ago  Answer Assessment - Initial Assessment Questions 1. ONSET: "When did it happen?" If happened < 3 hours ago, ask: "Did you apply cold water?" If not, give First Aid Advice immediately.      Happened December 04, 2023 2. LOCATION: "Where is the burn located?"      Inside of the palm where the thumb bends 3. BURN SIZE: "How large is the burn?"  The palm is roughly 1% of the total body surface area (BSA).     Nickel size 4. SEVERITY OF THE BURN: "Are there any blisters?"      Yes to blister 5. MECHANISM: "Tell me how it happened."     Patient states she burned her hand taking a pan out of the oven 6. PAIN: "Are you  having any pain?" "How bad is the pain?" (Scale 1-10; or mild, moderate, severe)   - MILD (1-3): doesn't interfere with normal activities    - MODERATE (4-7): interferes with normal activities or awakens from sleep    - SEVERE (8-10): excruciating pain, unable to do any normal activities      5 out of 10 7. INHALATION INJURY: "Were you exposed to any smoke or fumes?" If Yes, ask: "Do you have any cough or difficulty breathing?"     no 8. OTHER SYMPTOMS: "Do you have any other symptoms?" (e.g., headache, nausea)     No  Protocols used: Burns - Thermal-A-AH

## 2023-12-14 ENCOUNTER — Ambulatory Visit: Admitting: Nurse Practitioner

## 2023-12-14 ENCOUNTER — Encounter: Payer: Self-pay | Admitting: Nurse Practitioner

## 2023-12-14 VITALS — BP 122/82 | HR 67 | Temp 97.9°F | Ht 66.0 in | Wt 199.0 lb

## 2023-12-14 DIAGNOSIS — T23202A Burn of second degree of left hand, unspecified site, initial encounter: Secondary | ICD-10-CM | POA: Diagnosis not present

## 2023-12-14 NOTE — Patient Instructions (Signed)
 Nice to see you today This should continue healing Follow up if you do not continue to heal or have any questions or concerns

## 2023-12-14 NOTE — Assessment & Plan Note (Signed)
 No signs of infection. Should continue to heal. Keep clean with soap and water. Does not have to cover it unless in an area that is dirty. Clean burn. Top skin will likely peel off. S/s reviewed in regards to infection

## 2023-12-14 NOTE — Progress Notes (Signed)
   Acute Office Visit  Subjective:     Patient ID: Christina [name], female    DOB: 1976-01-01, 48 y.o.   MRN: [id number]  Chief Complaint  Patient presents with   Burn    On left hand April 12th; now blisters have formed and area is starting to hurt. Has been applying burn cream x first couple of days and keeping it covered.      Patient is in today for burn with a history of diverticulitis and chest pain   Date of injury was 12/04/2023 where patient. States that she was pullin gthe pan out of the oven. The hot pad slipped and it burned her hand. States that it did blister up white. She did run it under cold water after. States that on the sadturday -Monday it was hurting. States that over the last couple days it started hurting again In the beginning she uses a burn cream and would keep it covered Dull achin gpian that is intermittently. Will hurt with touch and then hurt for a bit and then stops Adivl did help    Review of Systems  Constitutional:  Negative for chills and fever.  Respiratory:  Negative for shortness of breath.   Cardiovascular:  Negative for chest pain.  Neurological:  Negative for tingling, weakness and headaches.        Objective:    BP 122/82 (BP Location: Left Arm, Patient Position: Sitting, Cuff Size: Normal)   Pulse 67   Temp 97.9 F (36.6 C) (Oral)   Ht 5\' 6"  (1.676 m)   Wt 199 lb (90.3 kg)   SpO2 99%   BMI 32.12 kg/m  BP Readings from Last 3 Encounters:  12/14/23 122/82  05/27/23 132/84  04/14/23 (!) 120/90   Wt Readings from Last 3 Encounters:  12/14/23 199 lb (90.3 kg)  05/27/23 200 lb (90.7 kg)  04/14/23 195 lb 6 oz (88.6 kg)   SpO2 Readings from Last 3 Encounters:  12/14/23 99%  05/27/23 98%  04/14/23 97%      Physical Exam Skin:    Findings: Lesion present.     Comments: See clinical photo        No results found for any visits on 12/14/23.      Assessment & Plan:   Problem List Items Addressed This Visit        Musculoskeletal and Integument   Second degree burn of left hand - Primary   No signs of infection. Should continue to heal. Keep clean with soap and water. Does not have to cover it unless in an area that is dirty. Clean burn. Top skin will likely peel off. S/s reviewed in regards to infection        No orders of the defined types were placed in this encounter.   Return if symptoms worsen or fail to improve.  Margarie Shay, NP

## 2024-01-21 ENCOUNTER — Ambulatory Visit: Payer: Self-pay

## 2024-01-21 NOTE — Telephone Encounter (Signed)
 Copied from CRM [id number]. Topic: Clinical - Red Word Triage >> Jan 21, 2024 10:43 AM [place] A wrote: [facility] that prompted transfer to Nurse Triage: Patient is having pain from Diverticulitis flare up-pt wrote Primary on Mychart and was informed that she needs to be seen.  Chief Complaint: Abdominal pain Symptoms: Nausea Frequency: A few days Pertinent Negatives: Patient denies fever Disposition: [] ED /[] Urgent Care (no appt availability in office) / [x] Appointment(In office/virtual)/ []  [facility]/ [] Home Care/ [] Refused Recommended Disposition /[] [facility]/ []  Follow-up with PCP Additional Notes: Patient called in to report mild to moderate LLQ abdominal pain. Patient stated pain stays localized. Patient stated she has a history of diverticulitis. Patient stated she is also experiencing nausea. Patient stated symptoms became constant on Wednesday. Patient denied fever and vomiting. Advised patient to be seen within 24 hours, per protocol. No availability in office until Monday. This RN called CAL for assistance with scheduling. CAL stated there is no availability in the office today and was unable to schedule at another [facility] clinic. Patient does not wish to seek care at [facility] at this time. Per MyChart message, provider inquired if patient would be able to come in on Monday and see another provider. Patient agreed. This RN scheduled patient in office early Monday morning with an alternate provider in office. This RN provided care advice and extensive education on when it would be appropriate to seek emergency care over the weekend. Patient verbalized understanding.  Reason for Disposition  [1] MODERATE pain (e.g., interferes with normal activities) AND [2] pain comes and goes (cramps) AND [3] present > 24 hours  (Exception: Pain with Vomiting or Diarrhea - see that Guideline.)  Answer Assessment - Initial Assessment Questions 1. LOCATION: "Where does it hurt?"       LLQ 2. RADIATION: "Does the pain shoot anywhere else?" (e.g., chest, back)     Denies 3. ONSET: "When did the pain begin?" (e.g., minutes, hours or days ago)      Constant pain started Wednesday  4. SUDDEN: "Gradual or sudden onset?"     Gradual 5. PATTERN "Does the pain come and go, or is it constant?"    - If it comes and goes: "How long does it last?" "Do you have pain now?"     (Note: Comes and goes means the pain is intermittent. It goes away completely between bouts.)    - If constant: "Is it getting better, staying the same, or getting worse?"      (Note: Constant means the pain never goes away completely; most serious pain is constant and gets worse.)      Constant low to moderate pain 6. SEVERITY: "How bad is the pain?"  (e.g., Scale 1-10; mild, moderate, or severe)    - MILD (1-3): Doesn't interfere with normal activities, abdomen soft and not tender to touch.     - MODERATE (4-7): Interferes with normal activities or awakens from sleep, abdomen tender to touch.     - SEVERE (8-10): Excruciating pain, doubled over, unable to do any normal activities.       Rates pain a 2 at this time, increased to 6-7 after eating 7. RECURRENT SYMPTOM: "Have you ever had this type of stomach pain before?" If Yes, ask: "When was the last time?" and "What happened that time?"      4 years ago 8. CAUSE: "What do you think is causing the stomach pain?"     Diverticulitis flare-up 9. RELIEVING/AGGRAVATING FACTORS: "  What makes it better or worse?" (e.g., antacids, bending or twisting motion, bowel movement)     Eating intensifies pain 10. OTHER SYMPTOMS: "Do you have any other symptoms?" (e.g., back pain, diarrhea, fever, urination pain, vomiting)       Nausea, chills- no fever, denies vomiting  Protocols used: Abdominal Pain - Female-A-AH

## 2024-01-21 NOTE — Telephone Encounter (Signed)
Noted. Agree with recommendations.  

## 2024-01-24 ENCOUNTER — Encounter: Payer: Self-pay | Admitting: Family

## 2024-01-24 ENCOUNTER — Ambulatory Visit
Admission: RE | Admit: 2024-01-24 | Discharge: 2024-01-24 | Disposition: A | Discharge status: Home / Self Care | Source: Ambulatory Visit | Attending: Family | Admitting: Family

## 2024-01-24 ENCOUNTER — Ambulatory Visit: Admitting: Family

## 2024-01-24 ENCOUNTER — Ambulatory Visit: Payer: Self-pay | Admitting: Family

## 2024-01-24 VITALS — BP 126/84 | HR 75 | Temp 97.9°F | Ht 66.0 in | Wt 199.8 lb

## 2024-01-24 DIAGNOSIS — R1084 Generalized abdominal pain: Secondary | ICD-10-CM | POA: Insufficient documentation

## 2024-01-24 DIAGNOSIS — R198 Other specified symptoms and signs involving the digestive system and abdomen: Secondary | ICD-10-CM | POA: Insufficient documentation

## 2024-01-24 DIAGNOSIS — R12 Heartburn: Secondary | ICD-10-CM

## 2024-01-24 LAB — CBC WITH DIFFERENTIAL/PLATELET
Basophils Absolute: 0.1 10*3/uL (ref 0.0–0.1)
Basophils Relative: 1.1 % (ref 0.0–3.0)
Eosinophils Absolute: 0.2 10*3/uL (ref 0.0–0.7)
Eosinophils Relative: 2.6 % (ref 0.0–5.0)
HCT: 43.4 % (ref 36.0–46.0)
Hemoglobin: 14.7 g/dL (ref 12.0–15.0)
Lymphocytes Relative: 31.2 % (ref 12.0–46.0)
Lymphs Abs: 1.9 10*3/uL (ref 0.7–4.0)
MCHC: 33.8 g/dL (ref 30.0–36.0)
MCV: 88 fl (ref 78.0–100.0)
Monocytes Absolute: 0.5 10*3/uL (ref 0.1–1.0)
Monocytes Relative: 8 % (ref 3.0–12.0)
Neutro Abs: 3.5 10*3/uL (ref 1.4–7.7)
Neutrophils Relative %: 57.1 % (ref 43.0–77.0)
Platelets: 338 10*3/uL (ref 150.0–400.0)
RBC: 4.94 Mil/uL (ref 3.87–5.11)
RDW: 13.1 % (ref 11.5–15.5)
WBC: 6.2 10*3/uL (ref 4.0–10.5)

## 2024-01-24 LAB — COMPREHENSIVE METABOLIC PANEL WITH GFR
ALT: 20 U/L (ref 0–35)
AST: 17 U/L (ref 0–37)
Albumin: 4.5 g/dL (ref 3.5–5.2)
Alkaline Phosphatase: 57 U/L (ref 39–117)
BUN: 13 mg/dL (ref 6–23)
CO2: 27 meq/L (ref 19–32)
Calcium: 9.6 mg/dL (ref 8.4–10.5)
Chloride: 105 meq/L (ref 96–112)
Creatinine, Ser: 0.78 mg/dL (ref 0.40–1.20)
GFR: 90.38 mL/min (ref >60.00)
Glucose, Bld: 80 mg/dL (ref 70–99)
Potassium: 4 meq/L (ref 3.5–5.1)
Sodium: 139 meq/L (ref 135–145)
Total Bilirubin: 0.3 mg/dL (ref 0.2–1.2)
Total Protein: 7.6 g/dL (ref 6.0–8.3)

## 2024-01-24 LAB — POCT URINE PREGNANCY: Preg Test, Ur: NEGATIVE

## 2024-01-24 LAB — AMYLASE: Amylase: 31 U/L (ref 27–131)

## 2024-01-24 LAB — LIPASE: Lipase: 13 U/L (ref 11.0–59.0)

## 2024-01-24 MED ORDER — OMEPRAZOLE 20 MG PO CPDR: 20.0000 mg | DELAYED_RELEASE_CAPSULE | Freq: Every day | ORAL | 0 refills | Status: AC

## 2024-01-24 MED ORDER — IOHEXOL 300 MG/ML  SOLN
100.0000 mL | Freq: Once | INTRAMUSCULAR | Status: AC | PRN
  Administered 2024-01-24: 100 mL via INTRAVENOUS

## 2024-01-24 MED ORDER — IOHEXOL 9 MG/ML PO SOLN
500.0000 mL | ORAL | Status: AC
  Administered 2024-01-24 (×2): 500 mL via ORAL

## 2024-01-24 NOTE — Addendum Note (Signed)
 Addended by: Dewanda Foots C on: 01/24/2024 10:16 AM   Modules accepted: Orders

## 2024-01-24 NOTE — Progress Notes (Signed)
 noted

## 2024-01-24 NOTE — Assessment & Plan Note (Signed)
 Stat CT abd pelvis w  Npo x one hour ago  Discussed urgent red flag precautions.  R/o cholecystitis, diverticulitis, pancreatitis (low suspicion) and or constipation/obstruction.  Labs ordered pending results as well.

## 2024-01-24 NOTE — Progress Notes (Signed)
 Established Patient Office Visit  Subjective:   Patient ID: Christina [name], female    DOB: Dec 16, 1975  Age: 48 y.o. MRN: [id number]  CC:  Chief Complaint  Patient presents with   Acute Visit    Abdominal pain x5 days.    HPI: Christina [name] is a 48 y.o. female presenting on 01/24/2024 for Acute Visit (Abdominal pain x5 days.)  So last week Wednesday started with left lower abdominal pain that is constant, worse after eating and she will become nauseous. She did reach out to PCP Friday May 30th and she was advised to change to bland diet which did help her some but still with the pain. The flare ups have improved slightly. Denies fever did have chills last week but not since. Bowel movements are pretty consistent however some seem pretty lose at times and other days feel harder than they should. Has taken some ibuprofen  with mild relief of symptoms. Has had diverticulitis in the past.  Has not vomited.   Most intense pain level was 7/10 last week, most recently probably 2/10 but can increase throughout the day especially after eating. Yesterday was about 4/5 out of 10 at its worse.   She did have a small amount of yogurt and a few bites of donut around 730 am.       ROS: Negative unless specifically indicated above in HPI.   Relevant past medical history reviewed and updated as indicated.   Allergies and medications reviewed and updated.   Current Outpatient Medications:    fluticasone (FLONASE) 50 MCG/ACT nasal spray, Place into both nostrils daily., Disp: , Rfl:    loratadine (CLARITIN) 10 MG tablet, Take 10 mg by mouth daily., Disp: , Rfl:    SLYND 4 MG TABS, Take 1 tablet by mouth daily., Disp: , Rfl:   Allergies  Allergen Reactions   Azithromycin     REACTION: [place]   Penicillins     V[place]   Prednisone      [place] and short of breath    Objective:   BP 126/84 (BP Location: Left Arm, Patient Position: Sitting, Cuff Size: Normal)   Pulse 75   Temp 97.9 F (36.6 C)  (Temporal)   Ht 5\' 6"  (1.676 m)   Wt 199 lb 12.8 oz (90.6 kg)   SpO2 98%   BMI 32.25 kg/m    Physical Exam Constitutional:      General: She is not in acute distress.    Appearance: Normal appearance. She is obese. She is not ill-appearing, toxic-appearing or diaphoretic.  HENT:     Head: Normocephalic.  Cardiovascular:     Rate and Rhythm: Normal rate.  Pulmonary:     Effort: Pulmonary effort is normal.  Abdominal:     General: Abdomen is flat. Bowel sounds are increased. There is no distension.     Palpations: Abdomen is soft.     Tenderness: There is abdominal tenderness in the right upper quadrant, left upper quadrant and left lower quadrant. There is no guarding or rebound. Positive signs include Murphy's sign. Negative signs include McBurney's sign.  Musculoskeletal:        General: Normal range of motion.  Neurological:     General: No focal deficit present.     Mental Status: She is alert and oriented to person, place, and time. Mental status is at baseline.  Psychiatric:        Mood and Affect: Mood normal.        Behavior: Behavior  normal.        Thought Content: Thought content normal.        Judgment: Judgment normal.     Assessment & Plan:  Positive Murphy Sign Assessment & Plan: Stat CT abd pelvis w  Npo x one hour ago  Discussed urgent red flag precautions.  R/o cholecystitis, diverticulitis, pancreatitis (low suspicion) and or constipation/obstruction.  Labs ordered pending results as well.    Orders: -     CT ABDOMEN PELVIS W CONTRAST; Future -     Lipase -     Amylase -     Comprehensive metabolic panel with GFR -     CBC with Differential/Platelet  Generalized abdominal pain -     CT ABDOMEN PELVIS W CONTRAST; Future -     Lipase -     Amylase -     Comprehensive metabolic panel with GFR -     CBC with Differential/Platelet -     Urinalysis w microscopic + reflex cultur -     POCT urine pregnancy     Follow up plan: Return for f/u PCP  if no improvement in symptoms.  Christina Horns, FNP

## 2024-01-25 LAB — URINALYSIS W MICROSCOPIC + REFLEX CULTURE
Bacteria, UA: NONE SEEN /HPF
Bilirubin Urine: NEGATIVE
Glucose, UA: NEGATIVE
Hgb urine dipstick: NEGATIVE
Hyaline Cast: NONE SEEN /LPF
Leukocyte Esterase: NEGATIVE
Nitrites, Initial: NEGATIVE
Protein, ur: NEGATIVE
Specific Gravity, Urine: 1.024 (ref 1.001–1.035)
WBC, UA: NONE SEEN /HPF (ref 0–5)
pH: 5.5 (ref 5.0–8.0)

## 2024-01-25 LAB — NO CULTURE INDICATED

## 2024-01-27 ENCOUNTER — Other Ambulatory Visit: Payer: Self-pay | Admitting: Family

## 2024-01-28 NOTE — Progress Notes (Signed)
 I called Tuesday 6/3 to have clarification on the addendum I had requested to the CT abd  Asking for clarity on more details in regards to the cyst.  Read by Dr. Anette Barb.  I was advised that they would touch base with Dr. Anette Barb however I have not heard back.   Called 6/6 again to get an update on this. They will try to get in touch with Dr. Anette Barb today and he can give me a call.

## 2024-05-13 ENCOUNTER — Other Ambulatory Visit: Payer: Self-pay | Admitting: Family

## 2024-05-13 DIAGNOSIS — R12 Heartburn: Secondary | ICD-10-CM
